# Patient Record
Sex: Male | Born: 1950
Health system: Southern US, Community
[De-identification: ages and names within clinical notes are randomized; demographics above are authoritative.]

## PROBLEM LIST (undated history)

## (undated) DIAGNOSIS — K589 Irritable bowel syndrome without diarrhea: Secondary | ICD-10-CM

## (undated) DIAGNOSIS — I728 Aneurysm of other specified arteries: Secondary | ICD-10-CM

## (undated) DIAGNOSIS — R911 Solitary pulmonary nodule: Secondary | ICD-10-CM

## (undated) DIAGNOSIS — I319 Disease of pericardium, unspecified: Secondary | ICD-10-CM

## (undated) DIAGNOSIS — R0602 Shortness of breath: Secondary | ICD-10-CM

## (undated) DIAGNOSIS — Z77090 Contact with and (suspected) exposure to asbestos: Secondary | ICD-10-CM

## (undated) DIAGNOSIS — Z87438 Personal history of other diseases of male genital organs: Secondary | ICD-10-CM

## (undated) DIAGNOSIS — K219 Gastro-esophageal reflux disease without esophagitis: Secondary | ICD-10-CM

## (undated) HISTORY — PX: SPINE SURGERY: SHX786

## (undated) HISTORY — DX: Solitary pulmonary nodule: R91.1

## (undated) HISTORY — DX: Irritable bowel syndrome, unspecified: K58.9

## (undated) HISTORY — DX: Shortness of breath: R06.02

## (undated) HISTORY — DX: Gastro-esophageal reflux disease without esophagitis: K21.9

## (undated) HISTORY — DX: Contact with and (suspected) exposure to asbestos: Z77.090

## (undated) HISTORY — DX: Personal history of other diseases of male genital organs: Z87.438

## (undated) HISTORY — DX: Aneurysm of other specified arteries: I72.8

---

## 1992-10-23 HISTORY — PX: HERNIA REPAIR: SHX51

## 2006-09-14 ENCOUNTER — Emergency Department (HOSPITAL_COMMUNITY): Admission: EM | Admit: 2006-09-14 | Discharge: 2006-09-14 | Payer: Self-pay | Admitting: Emergency Medicine

## 2009-04-02 ENCOUNTER — Encounter: Admission: RE | Admit: 2009-04-02 | Discharge: 2009-04-02 | Payer: Self-pay | Admitting: Family Medicine

## 2009-05-03 ENCOUNTER — Ambulatory Visit: Payer: Self-pay | Admitting: Emergency Medicine

## 2009-05-18 ENCOUNTER — Emergency Department (HOSPITAL_COMMUNITY): Admission: EM | Admit: 2009-05-18 | Discharge: 2009-05-18 | Payer: Self-pay | Admitting: Emergency Medicine

## 2009-06-03 ENCOUNTER — Ambulatory Visit: Payer: Self-pay | Admitting: Emergency Medicine

## 2009-06-18 ENCOUNTER — Ambulatory Visit: Payer: Self-pay | Admitting: Emergency Medicine

## 2009-06-18 DIAGNOSIS — R05 Cough: Secondary | ICD-10-CM

## 2009-06-18 DIAGNOSIS — R059 Cough, unspecified: Secondary | ICD-10-CM | POA: Insufficient documentation

## 2009-08-09 ENCOUNTER — Telehealth (INDEPENDENT_AMBULATORY_CARE_PROVIDER_SITE_OTHER): Payer: Self-pay | Admitting: *Deleted

## 2009-08-16 ENCOUNTER — Telehealth (INDEPENDENT_AMBULATORY_CARE_PROVIDER_SITE_OTHER): Payer: Self-pay | Admitting: *Deleted

## 2009-09-06 ENCOUNTER — Ambulatory Visit: Payer: Self-pay | Admitting: Emergency Medicine

## 2009-09-06 ENCOUNTER — Ambulatory Visit: Admission: RE | Admit: 2009-09-06 | Discharge: 2009-09-06 | Payer: Self-pay | Admitting: Emergency Medicine

## 2009-09-06 ENCOUNTER — Encounter: Payer: Self-pay | Admitting: Emergency Medicine

## 2009-09-13 ENCOUNTER — Telehealth (INDEPENDENT_AMBULATORY_CARE_PROVIDER_SITE_OTHER): Payer: Self-pay | Admitting: *Deleted

## 2009-09-27 ENCOUNTER — Telehealth (INDEPENDENT_AMBULATORY_CARE_PROVIDER_SITE_OTHER): Payer: Self-pay | Admitting: *Deleted

## 2009-09-28 ENCOUNTER — Telehealth (INDEPENDENT_AMBULATORY_CARE_PROVIDER_SITE_OTHER): Payer: Self-pay | Admitting: *Deleted

## 2009-10-11 ENCOUNTER — Ambulatory Visit: Payer: Self-pay | Admitting: Emergency Medicine

## 2009-10-12 ENCOUNTER — Telehealth (INDEPENDENT_AMBULATORY_CARE_PROVIDER_SITE_OTHER): Payer: Self-pay | Admitting: *Deleted

## 2009-10-18 ENCOUNTER — Telehealth: Payer: Self-pay | Admitting: Emergency Medicine

## 2009-10-28 ENCOUNTER — Emergency Department (HOSPITAL_COMMUNITY): Admission: EM | Admit: 2009-10-28 | Discharge: 2009-10-28 | Payer: Self-pay | Admitting: Emergency Medicine

## 2009-11-02 ENCOUNTER — Inpatient Hospital Stay (HOSPITAL_BASED_OUTPATIENT_CLINIC_OR_DEPARTMENT_OTHER): Admission: RE | Admit: 2009-11-02 | Discharge: 2009-11-02 | Payer: Self-pay | Admitting: Cardiology

## 2009-11-16 ENCOUNTER — Encounter: Payer: Self-pay | Admitting: Emergency Medicine

## 2009-12-01 ENCOUNTER — Telehealth: Payer: Self-pay | Admitting: Emergency Medicine

## 2009-12-06 ENCOUNTER — Encounter: Payer: Self-pay | Admitting: Emergency Medicine

## 2009-12-06 ENCOUNTER — Telehealth (INDEPENDENT_AMBULATORY_CARE_PROVIDER_SITE_OTHER): Payer: Self-pay | Admitting: *Deleted

## 2010-01-24 ENCOUNTER — Encounter: Payer: Self-pay | Admitting: Emergency Medicine

## 2010-02-07 ENCOUNTER — Encounter: Payer: Self-pay | Admitting: Emergency Medicine

## 2010-02-18 ENCOUNTER — Encounter: Payer: Self-pay | Admitting: Emergency Medicine

## 2010-05-09 ENCOUNTER — Ambulatory Visit: Payer: Self-pay | Admitting: Emergency Medicine

## 2010-05-09 DIAGNOSIS — R930 Abnormal findings on diagnostic imaging of skull and head, not elsewhere classified: Secondary | ICD-10-CM

## 2010-10-03 ENCOUNTER — Encounter
Admission: RE | Admit: 2010-10-03 | Discharge: 2010-10-03 | Payer: Self-pay | Source: Home / Self Care | Attending: Family Medicine | Admitting: Family Medicine

## 2010-10-23 HISTORY — PX: CERVICAL SPINE SURGERY: SHX589

## 2010-11-22 NOTE — Medication Information (Signed)
Summary: Nexium Approved/[facility]  Nexium Approved/[facility]   Imported By: Sherian Rein 02/23/2010 11:31:00  _____________________________________________________________________  External Attachment:    Type:   Image     Comment:   External Document

## 2010-11-22 NOTE — Progress Notes (Signed)
Summary: PRESCRIPT  Phone Note Call from Patient Call back at [phone]   Caller: Patient Call For: Evea Sheek Summary of Call: NEED REFILL FOR NEXIUM PT TAKE 2 PILLS A DAY PT AT THE PHARMACY INSURANCE HAVE PROBLEM WITH FILLING RX WOULD LIKE  SAMPLES UNTIL READY [facility] Initial call taken by: Rickard Patience,  December 01, 2009 10:31 AM  Follow-up for Phone Call        Spoke with pt; aware no samples in office; pt to call pharmacy and have them fax PA for qty to [place] to start PA.Reynaldo Minium CMA  December 01, 2009 10:51 AM

## 2010-11-22 NOTE — Assessment & Plan Note (Signed)
Summary: cough   Visit Type:  Follow-up Copy to:  Dr. Lynelle Doctor Primary Provider/Referring Provider:  Claude Manges, PA  CC:  The patient is here for follow-up. He says he is sob with exertion and has chest tightness. He denies any cough but says he is clearing his throat a lot. This is worse after eating.Marland Kitchen  History of Present Illness: 60 yo never smoker, hx IBS, prostatitis, allergic rhinitis, hx pulm nodule in setting prior asbestos. Last CT scan 2007, was stable from a comparison film 1991 (per pt's report, [facility] in [place]). Referred 05/03/09 for ? COPD following a CXR that was worrisome for hyperinflation. Was being evaluated for cough and chest congestion.   ROV 10/11/09 -- s/p FOB with upper airway irregularity, bx consistent with hyperkeratosis + actinomyces colonies!  Treated for GERD with omeprazole, he cannot recall if he took it or if it helped, have tried to address PND  1.   LUNG, RIGHT UPPER LOBE, BIOPSY:  MINUTE FRAGMENT OF BENIGN BRONCHIAL EPITHELIUM, NO ATYPIA, GRANULOMA OR MALIGNANCY IDENTIFIED.   2.  LARYNX, SUPRAGLOTTIC, BIOPSY:  BENIGN SQUAMOUS MUCOSA WITH FOCAL HYPERKERATOSIS AND PARAKERATOSIS AND ASSOCIATED ACTINOMYCES COLONIES, NO DYSPLASIA OR MALIGNANCY IDENTIFIED.  ROV 05/09/10 -- returns for f/u. Still feels pressure and congestion in his chest. Gets winded with exertion. Still gets indigestion, gets PVC's after eating - eval by [facility] and had reassuring cath. PFT's borderline for AFL. Still on Nexium, helped the cough and chest pressure some. Saw Dr Dionne Ano regarding UA irritation, identified 80% septal devation that needs repair. On saline spray and nasal steroid few times a week.   Current Medications (verified): 1)  Aspirin 81 Mg Tbec (Aspirin) .... Take 1 Tablet By Mouth Once A Day 2)  Glucosamine 1500mg  + Msm 1500mg  .... Once Daily 3)  Pravastatin Sodium 40 Mg Tabs (Pravastatin Sodium) .... Take 1 Tablet By Mouth Once A Day 4)  Preservision  Areds  Caps (Multiple Vitamins-Minerals) .Marland Kitchen.. 1 Tab Two Times A Day 5)  Mens Multivitamin Plus  Tabs (Multiple Vitamins-Minerals) .... Once Daily 6)  Loratadine 10 Mg Tabs (Loratadine) .... Take 1 Tablet By Mouth Once A Day 7)  Nasonex 50 Mcg/act  Susp (Mometasone Furoate) .... Two Puffs Each Nostril Daily 8)  Nexium 40 Mg Cpdr (Esomeprazole Magnesium) .... Take 1 Capsule By Mouth Once A Day 9)  Keflex 750 Mg Caps (Cephalexin) .Marland Kitchen.. 1 By Mouth Four Times Daily For Underarm Cysts  Allergies (verified): 1)  ! Epinephrine  Vital Signs:  Patient profile:   60 year old male Height:      73 inches (185.42 cm) Weight:      185.38 pounds (84.26 kg) BMI:     24.55 O2 Sat:      99 % on Room air Temp:     97.6 degrees F (36.44 degrees C) oral Pulse rate:   66 / minute BP sitting:   102 / 64  (right arm) Cuff size:   regular  Vitals Entered By: Michel Bickers CMA (May 09, 2010 9:49 AM)  O2 Sat at Rest %:  99 O2 Flow:  Room air CC: The patient is here for follow-up. He says he is sob with exertion and has chest tightness. He denies any cough but says he is clearing his throat a lot. This is worse after eating. Comments Medications reviewed. Daytime phone verified. Michel Bickers CMA  May 09, 2010 9:49 AM   Physical Exam  General:  well developed, well nourished, in no acute  distress Head:  normocephalic and atraumatic Eyes:  conjunctiva and sclera clear Nose:  no deformity, discharge, inflammation, or lesions Mouth:  posterior pharyngeal erythema, Mallenpati 1 Neck:  no masses, thyromegaly, or abnormal cervical nodes Chest Wall:  no deformities noted Lungs:  clear bilaterally to auscultation and percussion Heart:  regular rate and rhythm, S1, S2 without murmurs, rubs, gallops, or clicks Abdomen:  not examined Msk:  no deformity or scoliosis noted with normal posture Extremities:  no clubbing, cyanosis, edema, or deformity noted Neurologic:  non-focal Skin:  intact without lesions or  rashes Psych:  alert and cooperative; normal mood and affect; normal attention span and concentration   Impression & Recommendations:  Problem # 1:  COUGH (ICD-786.2)  Believe contributions of nasal obstruction and GERD. PFT's reassuring - Nexium - f/u w Annalee Genta for possible repair septal deviation and obstruction - ROV 1 yr  Orders: Est. Patient Level IV (76283)  Problem # 2:  CT, CHEST, ABNORMAL (ICD-793.1)  Hx asbestos exposure with stable nodule in [place] - f/u 1 yr and reimage at that time, probably CXR  Orders: Est. Patient Level IV (15176)  Medications Added to Medication List This Visit: 1)  Keflex 750 Mg Caps (Cephalexin) .Marland Kitchen.. 1 by mouth four times daily for underarm cysts  Patient Instructions: 1)  Continue to follow with Dr Annalee Genta as planned. 2)  Continue your Nexium once daily  3)  Follow up with Dr Delton Coombes in 1 year or as needed

## 2010-11-22 NOTE — Medication Information (Signed)
Summary: Nexium/[facility]  Nexium/[facility]   Imported By: Sherian Rein 02/23/2010 11:33:14  _____________________________________________________________________  External Attachment:    Type:   Image     Comment:   External Document

## 2010-11-22 NOTE — Letter (Signed)
Summary: [facility]  [facility]   Imported By: Sherian Rein 03/01/2010 08:06:34  _____________________________________________________________________  External Attachment:    Type:   Image     Comment:   External Document

## 2010-11-22 NOTE — Medication Information (Signed)
Summary: [facility]  [facility]   Imported By: Sherian Rein 12/11/2009 11:40:48  _____________________________________________________________________  External Attachment:    Type:   Image     Comment:   External Document

## 2010-11-22 NOTE — Consult Note (Signed)
Summary: [facility]  [facility]   Imported By: Lester Sutherland 12/14/2009 08:11:36  _____________________________________________________________________  External Attachment:    Type:   Image     Comment:   External Document

## 2010-11-22 NOTE — Progress Notes (Signed)
Summary: returning call  Phone Note Call from Patient Call back at [phone]   Caller: Patient Call For: byrum Summary of Call: returning phone call from last week. Initial call taken by: Darletta Moll,  December 06, 2009 11:00 AM  Follow-up for Phone Call        Lawson Fiscal, did you call this pt? Please advise, thanks! Vernie Murders  December 06, 2009 11:01 AM  The only thing I see is from 12/01/09 and Florentina Addison spoke with the patient Re: Nexium and prior auth. I did not try to call him. Michel Bickers [facility]  December 06, 2009 11:31 AM   Additional Follow-up for Phone Call Additional follow up Details #1::        Called and spoke with pt.  He states that after he spoke with Florentina Addison about nexium PA, he got a msg from Jakin to call us back.  Please advise what to tell pt thanks Vernie Murders  December 06, 2009 11:41 AM'    Additional Follow-up for Phone Call Additional follow up Details #2::    sorry, my documentation was on the PA request form.  per RB's note in 09/2009, pt was to try the nexium two times a day x30month then go to once daily.  i asked patient if he would be willing to try the nexium once daily and not worry about the PA, or if he still wants to try it two times a day and wait for the PA.  pt stated that he is willing to try it once daily.    rx changed on pt's med list.  called spoke with pharmacist and informed them that pt will be trying the nexium once daily.  pharmacist verbalized her understanding.  PA request sent to be scanned in EMR. Follow-up by: Boone Master CNA,  December 06, 2009 12:59 PM  New/Updated Medications: NEXIUM 40 MG CPDR (ESOMEPRAZOLE MAGNESIUM) Take 1 capsule by mouth once a day Prescriptions: NEXIUM 40 MG CPDR (ESOMEPRAZOLE MAGNESIUM) Take 1 capsule by mouth once a day  #30 x 3   Entered by:   Boone Master CNA   Authorized by:   Leslye Peer MD   Signed by:   Boone Master CNA on 12/06/2009   Method used:   Telephoned to ...       [facility]  [address]  [phone]* (retail)       [address]       [place], [place]  [address]       Ph: [phone] or [phone]       Fax: [phone]   RxID:   [id number]

## 2011-01-08 LAB — BASIC METABOLIC PANEL
BUN: 14 mg/dL (ref 6–23)
Chloride: 104 mEq/L (ref 96–112)
Creatinine, Ser: 1.05 mg/dL (ref 0.4–1.5)
GFR calc Af Amer: >60 mL/min (ref >60)
GFR calc non Af Amer: >60 mL/min (ref >60)
Potassium: 3.9 mEq/L (ref 3.5–5.1)

## 2011-01-08 LAB — D-DIMER, QUANTITATIVE: D-Dimer, Quant: <0.22 ug/mL-FEU (ref 0.00–0.48)

## 2011-01-08 LAB — DIFFERENTIAL
Lymphocytes Relative: 9 % — ABNORMAL LOW (ref 12–46)
Lymphs Abs: 0.9 10*3/uL (ref 0.7–4.0)
Monocytes Relative: 8 % (ref 3–12)
Neutrophils Relative %: 82 % — ABNORMAL HIGH (ref 43–77)

## 2011-01-08 LAB — POCT CARDIAC MARKERS
CKMB, poc: <1 ng/mL — ABNORMAL LOW (ref 1.0–8.0)
Myoglobin, poc: 75.1 ng/mL (ref 12–200)
Troponin i, poc: <0.05 ng/mL (ref 0.00–0.09)
Troponin i, poc: <0.05 ng/mL (ref 0.00–0.09)

## 2011-01-08 LAB — CBC
Platelets: 207 10*3/uL (ref 150–400)
RBC: 4.53 MIL/uL (ref 4.22–5.81)
WBC: 10 10*3/uL (ref 4.0–10.5)

## 2011-01-23 ENCOUNTER — Other Ambulatory Visit: Payer: Self-pay | Admitting: Emergency Medicine

## 2011-01-23 MED ORDER — ESOMEPRAZOLE MAGNESIUM 40 MG PO CPDR: 40.0000 mg | DELAYED_RELEASE_CAPSULE | Freq: Every day | ORAL | Status: DC

## 2011-01-23 NOTE — Telephone Encounter (Signed)
New RX for Nexium called to [facility].

## 2011-01-26 ENCOUNTER — Ambulatory Visit: Payer: 59 | Admitting: Physical Therapy

## 2012-05-07 ENCOUNTER — Ambulatory Visit
Admission: RE | Admit: 2012-05-07 | Discharge: 2012-05-07 | Disposition: A | Discharge status: Home / Self Care | Payer: 59 | Source: Ambulatory Visit | Attending: Family Medicine | Admitting: Family Medicine

## 2012-05-07 ENCOUNTER — Other Ambulatory Visit: Payer: Self-pay | Admitting: Family Medicine

## 2012-05-07 DIAGNOSIS — R109 Unspecified abdominal pain: Secondary | ICD-10-CM

## 2012-05-07 MED ORDER — IOHEXOL 300 MG/ML  SOLN
100.0000 mL | Freq: Once | INTRAMUSCULAR | Status: AC | PRN
  Administered 2012-05-07: 100 mL via INTRAVENOUS

## 2012-05-07 MED ORDER — IOHEXOL 300 MG/ML  SOLN
30.0000 mL | Freq: Once | INTRAMUSCULAR | Status: AC | PRN
  Administered 2012-05-07: 30 mL via ORAL

## 2012-05-20 ENCOUNTER — Encounter: Payer: Self-pay | Admitting: Vascular Surgery

## 2012-05-23 ENCOUNTER — Encounter: Payer: Self-pay | Admitting: Vascular Surgery

## 2012-05-24 ENCOUNTER — Encounter: Payer: Self-pay | Admitting: Vascular Surgery

## 2012-05-24 ENCOUNTER — Ambulatory Visit (INDEPENDENT_AMBULATORY_CARE_PROVIDER_SITE_OTHER): Payer: 59 | Admitting: Vascular Surgery

## 2012-05-24 VITALS — BP 127/63 | HR 68 | Temp 98.3°F | Ht 73.0 in | Wt 182.0 lb

## 2012-05-24 DIAGNOSIS — I728 Aneurysm of other specified arteries: Secondary | ICD-10-CM | POA: Insufficient documentation

## 2012-05-24 NOTE — Progress Notes (Signed)
[facility] [place]  Referred by: Cain Saupe, MD [address] [place] [place] [address],  Reason for referral: celiac and splenic aneurysm  History of Present Illness  The patient is a 61 y.o. (Sep 02, 1951) male who presents with chief complaint: resolving abdominal.  The patient was seen by his PCP a few weeks ago with moderate intensity abdominal pain of vague character, associated with some diarrhea without fever or chills.  As part of that work-up, he underwent a CT Abd/pelvis which found a splenic and celiac aneurysm.  The rest of his work-up was consistent with a bacterial infection which was then treated with antibiotics.  At this point, his pain is mostly resolved.  He does not some chronic "pulling" sensation in RLQ which previously was attributed to a RIH, which was repaired.  The patient does not have back pain.  The patient's risk factors for aneurysmal disease included: family hx (uncle had AAA).  The patient never smoked cigarettes.  Past Medical History  Diagnosis Date  . SOB (shortness of breath) on exertion   . IBS (irritable bowel syndrome)   . H/O prostatitis   . Pulmonary nodule   . H/O asbestos exposure   . GERD (gastroesophageal reflux disease)   . Celiac artery aneurysm     Past Surgical History  Procedure Date  . Hernia repair 1994    right   . Cervical spine surgery 2012    History   Social History  . Marital Status: Married    Spouse Name: N/A    Number of Children: N/A  . Years of Education: N/A   Occupational History  . Not on file.   Social History Main Topics  . Smoking status: Never Smoker   . Smokeless tobacco: Never Used  . Alcohol Use: 1.2 oz/week    2 Glasses of wine per week  . Drug Use: No  . Sexually Active: Not on file   Other Topics Concern  . Not on file   Social History Narrative  . No narrative on file    Family History  Problem Relation Age of Onset  . Cancer Mother   . Diabetes  Father   . Heart attack Brother     Current Outpatient Prescriptions on File Prior to Visit  Medication Sig Dispense Refill  . aspirin 81 MG tablet Take 81 mg by mouth daily.      Marland Kitchen esomeprazole (NEXIUM) 40 MG capsule Take 40 mg by mouth daily before breakfast.      . ezetimibe (ZETIA) 10 MG tablet Take 10 mg by mouth daily.      . Glucosamine-Chondroit-Vit C-Mn (GLUCOSAMINE 1500 COMPLEX PO) Take by mouth.      . loratadine (CLARITIN) 10 MG tablet Take 10 mg by mouth daily.      . mometasone (NASONEX) 50 MCG/ACT nasal spray Place 2 sprays into the nose daily.      . Multiple Vitamin (MULTIVITAMIN) tablet Take 1 tablet by mouth daily.      Marland Kitchen esomeprazole (NEXIUM) 40 MG capsule Take 1 capsule (40 mg total) by mouth daily.  90 capsule  3  . pravastatin (PRAVACHOL) 40 MG tablet Take 40 mg by mouth daily.        Allergies  Allergen Reactions  . Epinephrine     REACTION: PVC's    REVIEW OF SYSTEMS:  (Positives checked otherwise negative)  CARDIOVASCULAR: [ ]  chest pain    [ ]  chest pressure    [ ]   palpitations   [ ]  orthopnea   [ ]  dyspnea on exert. [ ]  claudication    [ ]  rest pain     [ ]  DVT     [ ]  phlebitis  PULMONARY:    [ ]  productive cough [ ]  asthma  [ ]  wheezing  NEUROLOGIC:    [ ]  weakness    [ ]  paresthesias   [ ]  aphasia    [ ]  amaurosis    [ ]  dizziness  HEMATOLOGIC:    [ ]  bleeding problems  [ ]  clotting disorders  MUSCULOSKEL: [ ]  joint pain     [ ]  joint swelling  GASTROINTEST:  [ ]   blood in stool   [ ]   hematemesis  GENITOURINARY:   [ ]   dysuria    [ ]   hematuria  PSYCHIATRIC:   [ ]  history of major depression  INTEGUMENTARY: [ ]  rashes    [ ]  ulcers  CONSTITUTIONAL:  [ ]  fever     [ ]  chills  Physical Examination  Filed Vitals:   05/24/12 0838  BP: 127/63  Pulse: 68  Temp: 98.3 F (36.8 C)  TempSrc: Oral  Height: 6\' 1"  (1.854 m)  Weight: 182 lb (82.555 kg)  SpO2: 100%   Body mass index is 24.01 kg/(m^2).  General: A&O x 3, WDWN  Head:  [place]/AT  Ear/Nose/Throat: Hearing grossly intact, nares w/o erythema or drainage, oropharynx w/o Erythema/Exudate  Eyes: PERRLA, EOMI  Neck: Supple, no nuchal rigidity, no palpable LAD  Pulmonary: Sym exp, good air movt, CTAB, no rales, rhonchi, & wheezing  Cardiac: RRR, Nl S1, S2, no Murmurs, rubs or gallops  Vascular: Vessel Right Left  Radial Palpable Palpable  Brachial Palpable Palpable  Carotid Palpable, without bruit Palpable, without bruit  Aorta Non-palpable N/A  Femoral Palpable Palpable  Popliteal Non-palpable Non-palpable  PT Non-Palpable Faintly Palpable  DP Palpable Palpable   Gastrointestinal: soft, NTND, -G/R, - HSM, - masses, - CVAT B  Musculoskeletal: M/S 5/5 throughout , Extremities without ischemic changes   Neurologic: CN 2-12 intact , Pain and light touch intact in extremities   Psychiatric: Judgment intact, Mood & affect appropriate for pt's clinical situation  Dermatologic: See M/S exam for extremity exam, no rashes otherwise noted  Lymph : No Cervical, Axillary, or Inguinal lymphadenopathy   CT Abd/Pelvis w/ contrast (05/07/12)   No extraluminal bowel inflammatory process, free fluid or free air.   Portions of colon and small bowel (including terminal ileum) are under distended slightly limiting evaluation.   Aneurysmal dilation of the celiac artery measuring 1.4 x 1.9 cm spanning over 2.5 cm length.   Suggestive of splenic artery aneurysm measuring up to 10.6 mm in the region of the pancreatic body tail junction (series 2 image 23).   Primary pancreatic lesion felt to be less likely consideration.   Right base lung nodule with follow up as noted above.  Based on my evaluation of the CT, this patient has a celiac artery aneurysm without any signs of impending rupture and no thrombus.  I cannot clearly identify a splenic artery aneurysm though there is a calcified area that could be such.  Outside Studies/Documentation 3 pages of outside  documents were reviewed including: CT report.  Medical Decision Making  The patient is a 61 y.o. male who presents with: celiac artery aneurysm, possible splenic artery calcification   Based on this patient's exam and diagnostic studies, he needs annual CTA Abd/pelvis to evaluate celiac artery aneurysm and evaluate  the splenic artery.  CA aneurysm is rare so there is no consensus in the literature in regards to repair.  I don't see any thing clinically or radiographically that would force intervention at this point.  The patient agrees with annual surveillance at this point.  I don't think a mesenteric duplex will be adequate given the location of these two vascular bed.  The bowel gas interference would greatly degrade the value of any ultrasound study.  Even a 1 cm asx splenic aneurysm does not need intervention, so I would fully evaluate this in one year with the CTA  I emphasized the importance of maximal medical management including strict control of blood pressure, blood glucose, and lipid levels, antiplatelet agents, obtaining regular exercise, and cessation of smoking.    Thank you for allowing [place] to participate in this patient's care.  Leonides Sake, MD [facility] Office: [phone] Pager: [phone]  05/24/2012, 9:05 AM

## 2012-08-26 ENCOUNTER — Other Ambulatory Visit: Payer: Self-pay | Admitting: Gastroenterology

## 2012-08-30 ENCOUNTER — Other Ambulatory Visit: Payer: Self-pay | Admitting: Family Medicine

## 2012-08-30 DIAGNOSIS — H532 Diplopia: Secondary | ICD-10-CM

## 2012-09-02 ENCOUNTER — Ambulatory Visit
Admission: RE | Admit: 2012-09-02 | Discharge: 2012-09-02 | Disposition: A | Discharge status: Home / Self Care | Payer: 59 | Source: Ambulatory Visit | Attending: Family Medicine | Admitting: Family Medicine

## 2012-09-02 DIAGNOSIS — H532 Diplopia: Secondary | ICD-10-CM

## 2012-11-22 ENCOUNTER — Other Ambulatory Visit: Payer: Self-pay | Admitting: Ophthalmology

## 2012-11-22 ENCOUNTER — Ambulatory Visit
Admission: RE | Admit: 2012-11-22 | Discharge: 2012-11-22 | Disposition: A | Discharge status: Home / Self Care | Payer: 59 | Source: Ambulatory Visit | Attending: Ophthalmology | Admitting: Ophthalmology

## 2012-11-22 DIAGNOSIS — Z01811 Encounter for preprocedural respiratory examination: Secondary | ICD-10-CM

## 2012-11-23 HISTORY — PX: EYE SURGERY: SHX253

## 2013-05-16 ENCOUNTER — Telehealth: Payer: Self-pay | Admitting: Vascular Surgery

## 2013-05-16 ENCOUNTER — Other Ambulatory Visit: Payer: Self-pay | Admitting: *Deleted

## 2013-05-16 DIAGNOSIS — I728 Aneurysm of other specified arteries: Secondary | ICD-10-CM

## 2013-05-16 NOTE — Telephone Encounter (Signed)
Message copied by Jena Gauss on Fri May 16, 2013 11:48 AM ------      Message from: Melene Plan      Created: Tue May 13, 2013 10:36 AM      Regarding: FW: CTA       Jilda Panda, this is a mess; I understand why Elon Jester doesn't understand. However,looking at the [facility] chart he needs this appt and a CTA for yearly follow up of a celiac and splenic artery aneurysm. I don't understand why it wasn't approved without MDs talking. We need to get this approved without [facility] involved if possible.      Thanks      Darel Hong      ----- Message -----         From: Jena Gauss         Sent: 05/13/2013  10:05 AM           To: Melene Plan, RN      Subject: FW: CTA                                                  Please decode this. Does [facility] want a f/u appt on 05/30/13 or should I cancel that?            ----- Message -----         From: Fransisco Hertz, MD         Sent: 05/13/2013   9:52 AM           To: Jena Gauss      Subject: RE: CTA                                                  Annual follow up with CTA            ----- Message -----         From: Jena Gauss         Sent: 05/13/2013   9:00 AM           To: Fransisco Hertz, MD      Subject: RE: CTA                                                  Would you like me to also cancel the f/u?            ----- Message -----         From: Kateri Mc         Sent: 05/12/2013   3:34 PM           To: [facility]      Subject: FW: CTA                                                  Probably not, but check with Dr. Imogene Burn.                     -----  Message -----         From: Jena Gauss         Sent: 05/12/2013   3:17 PM           To: Kateri Mc      Subject: RE: CTA                                                  Shall I cancel the f/u as well?            ----- Message -----         From: Kateri Mc         Sent: 05/12/2013   1:13 PM           To: [facility] Pool      Subject: FW: CTA                                                   PLEASE SEE DR. CHEN'S NOTE AND CANCEL THIS PATIENT'S CTA.  Rollene Fare, DEBBIE                        ----- Message -----         From: Fransisco Hertz, MD         Sent: 05/12/2013  12:18 PM           To: Kateri Mc      Subject: RE: CTA                                                  Don't need CTA this year, rather one NEXT year 2015.            ----- Message -----         From: Kateri Mc         Sent: 05/05/2013   2:37 PM           To: Fransisco Hertz, MD      Subject: CTA                                                      Dr Imogene Burn, [facility] has requested a peer-to-peer review for this patient in order to pre-cert the CTA of the abdomen an pelvis scheduled for 05/30/13.  Please call [phone] option #3 and case # [id number] for the review.  I will need the pre-cert information once this is authorized, so please let me know when complete.             If you have any questions, please let me know.              Thank you,            Chad Cordial                                                 ------

## 2013-05-29 ENCOUNTER — Encounter: Payer: Self-pay | Admitting: Vascular Surgery

## 2013-05-30 ENCOUNTER — Ambulatory Visit
Admission: RE | Admit: 2013-05-30 | Discharge: 2013-05-30 | Disposition: A | Discharge status: Home / Self Care | Payer: 59 | Source: Ambulatory Visit | Attending: Vascular Surgery | Admitting: Vascular Surgery

## 2013-05-30 ENCOUNTER — Encounter: Payer: Self-pay | Admitting: Vascular Surgery

## 2013-05-30 ENCOUNTER — Other Ambulatory Visit: Payer: 59

## 2013-05-30 ENCOUNTER — Ambulatory Visit (INDEPENDENT_AMBULATORY_CARE_PROVIDER_SITE_OTHER): Payer: 59 | Admitting: Vascular Surgery

## 2013-05-30 VITALS — BP 128/76 | HR 50 | Ht 73.0 in | Wt 183.8 lb

## 2013-05-30 DIAGNOSIS — I728 Aneurysm of other specified arteries: Secondary | ICD-10-CM

## 2013-05-30 MED ORDER — IOHEXOL 350 MG/ML SOLN
100.0000 mL | Freq: Once | INTRAVENOUS | Status: AC | PRN
  Administered 2013-05-30: 100 mL via INTRAVENOUS

## 2013-05-30 NOTE — Progress Notes (Signed)
[facility] [place]  Established Mesenteric Aneurysm  History of Present Illness  The patient is a 62 y.o. (05-01-1951) male who presents with chief complaint: follow up for celiac and splenic aneurysm.  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  The patient denies any back or abdominal pain.  He has complete resolution of his prior abdominal pain.  He denies any hematemesis, hematochezia, or melena.  The patient is not a smoker.  The patient's PMH, PSH, SH, FamHx, Med, and Allergies are unchanged from 05/24/12.  On ROS today: no abdominal pain, no flank pain, no smoking history  Physical Examination  Filed Vitals:   05/30/13 1057  BP: 128/76  Pulse: 50  Height: 6\' 1"  (1.854 m)  Weight: 183 lb 12.8 oz (83.371 kg)  SpO2: 100%   Body mass index is 24.25 kg/(m^2).  General: A&O x 3, WD, thin  Pulmonary: Sym exp, good air movt, CTAB, no rales, rhonchi, & wheezing  Cardiac: RRR, Nl S1, S2, no Murmurs, rubs or gallops  Vascular: Vessel Right Left  Radial Palpable Palpable  Brachial Palpable Palpable  Carotid Palpable, without bruit Palpable, without bruit  Aorta Not palpable N/A  Femoral Palpable Palpable  Popliteal Not palpable Not palpable  PT Palpable Palpable  DP Palpable Palpable   Gastrointestinal: soft, NTND, -G/R, - HSM, - masses, - CVAT B  Musculoskeletal: M/S 5/5 throughout , Extremities without ischemic changes   Neurologic: Pain and light touch intact in extremities , Motor exam as listed above  CTA Abd/pelvis (05/30/2013)  1. Stable to incrementally enlarged saccular celiac artery aneurysm measures 2.1 x 1.1 cm in greatest transverse dimension.  The aneurysm is best measured on the coronal reformats. Recommend annual follow-up abdominal CTA for at least two additional years to confirm stability.  2. Stable small splenic artery aneurysms in the mid splenic artery (region of the pancreatic body) and splenic  hilum.  3. One year stability of 6 mm right lower lobe pulmonary nodule. Recommend continued attention on annual follow-up imaging.  4. Additional ancillary findings as above without significant interval change.  Based on my review of this patient CTA, his CA aneurysm is unchanged with diameter ~1.4 mm.  An orthogonal measurement would be necessary to be completely accurate, which is not possible without 3D recon.  There are  two small splenic aneurysms (<2 cm).  Medical Decision Making  The patient is a 62 y.o. male who presents with: asymptomatic celiac aneurysm of no change in size, two small splenic aneurysms.   The patient is completely asx at this point with minimal change in celiac aneurysm size.  There is controversy in the literature in regards management of celiac artery aneurysm due to the limited number of such in any center.   I discussed the risk of non-operative management including rupture and subsequent death, but we agreed that at this point, there is nothing forcing [place] to proceed with surgical intervention on his celiac aneurysm.    The two splenic aneurysms are too small to require any immediate intervention.  We will continued with q6 mesenteric duplex and repeat the CTA in 2 years if the duplex is stable.  This is to limit the radiation risks of the CTA.  I emphasized the importance of maximal medical management including strict control of blood pressure, blood glucose, and lipid levels, antiplatelet agents, obtaining regular exercise, and cessation of smoking.    Thank you for allowing [place] to participate in this patient's care.  Leonides Sake, MD [facility] [place] Office: [phone] Pager: [phone]  05/30/2013, 1:32 PM

## 2013-08-28 ENCOUNTER — Other Ambulatory Visit: Payer: Self-pay

## 2013-12-05 ENCOUNTER — Other Ambulatory Visit (HOSPITAL_COMMUNITY): Payer: 59

## 2013-12-05 ENCOUNTER — Ambulatory Visit: Payer: 59 | Admitting: Vascular Surgery

## 2013-12-22 ENCOUNTER — Other Ambulatory Visit (HOSPITAL_COMMUNITY): Payer: 59

## 2013-12-22 ENCOUNTER — Ambulatory Visit: Payer: 59 | Admitting: Family

## 2014-01-02 ENCOUNTER — Encounter: Payer: Self-pay | Admitting: Family

## 2014-01-05 ENCOUNTER — Ambulatory Visit: Payer: 59 | Admitting: Family

## 2014-01-05 ENCOUNTER — Other Ambulatory Visit (HOSPITAL_COMMUNITY): Payer: 59

## 2014-01-16 ENCOUNTER — Encounter: Payer: Self-pay | Admitting: Family

## 2014-01-19 ENCOUNTER — Encounter: Payer: Self-pay | Admitting: Family

## 2014-01-19 ENCOUNTER — Ambulatory Visit (HOSPITAL_COMMUNITY)
Admission: RE | Admit: 2014-01-19 | Discharge: 2014-01-19 | Disposition: A | Discharge status: Home / Self Care | Payer: 59 | Source: Ambulatory Visit | Attending: Family | Admitting: Family

## 2014-01-19 ENCOUNTER — Ambulatory Visit (INDEPENDENT_AMBULATORY_CARE_PROVIDER_SITE_OTHER): Payer: 59 | Admitting: Family

## 2014-01-19 VITALS — BP 116/74 | HR 61 | Resp 14 | Ht 73.0 in | Wt 187.0 lb

## 2014-01-19 DIAGNOSIS — I728 Aneurysm of other specified arteries: Secondary | ICD-10-CM

## 2014-01-19 NOTE — Progress Notes (Signed)
Established Mesenteric Aneurysms  History of Present Illness  Leonard Noble is a 63 y.o. (1951/08/29) male patient of Dr. Bridgett Larsson who presents with chief complaint: follow up for celiac and splenic aneurysm. 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 The patient denies any back or abdominal pain. He has complete resolution of his prior abdominal pain. He denies any hematemesis, hematochezia, or melena. The patient is not a smoker.  The patient denies food fear.   He has mid abdominal intermittent pain for the last couple of days, associated with what he eats the night before. He denies weight loss, put on a few pounds over the Winter. He states he has borderline DM, his father had late onset DM. He denies cardiac issues other than occasional PVC's. His ophthalmologist told him he may have had an occular stroke in the left eye in the past, or the findings may be due to an old blow to the head. He no longer takes pravastatin due to myalgias. 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 He sees a cardiologist regularly.   Past Medical History  Diagnosis Date  . SOB (shortness of breath) on exertion   . IBS (irritable bowel syndrome)   . H/O prostatitis   . Pulmonary nodule   . H/O asbestos exposure   . GERD (gastroesophageal reflux disease)   . Celiac artery aneurysm     Past Surgical History  Procedure Laterality Date  . Hernia repair  1994    right   . Cervical spine surgery  2012   History   Social History  . Marital Status: Married    Spouse Name: N/A    Number of Children: N/A  . Years of Education: N/A   Occupational History  . Not on file.   Social History Main Topics  . Smoking status: Never Smoker   . Smokeless tobacco: Never Used  . Alcohol Use: 1.2 oz/week    2 Glasses of wine per week  . Drug Use:  No  . Sexual Activity: Not on file   Other Topics Concern  . Not on file   Social History Narrative  . No narrative on file   Family History  Problem Relation Age of Onset  . Cancer Mother   . Diabetes Father   . Heart attack Brother    Current Outpatient Prescriptions on File Prior to Visit  Medication Sig Dispense Refill  . amoxicillin (AMOXIL) 500 MG tablet Take 500 mg by mouth 2 (two) times daily.      Marland Kitchen aspirin 81 MG tablet Take 81 mg by mouth daily.      . clarithromycin (BIAXIN) 500 MG tablet Take 500 mg by mouth Twice daily.      Marland Kitchen esomeprazole (NEXIUM) 40 MG capsule Take 1 capsule (40 mg total) by mouth daily.  90 capsule  3  . esomeprazole (NEXIUM) 40 MG capsule Take 40 mg by mouth daily before breakfast.      . ezetimibe (ZETIA) 10 MG tablet Take 10 mg by mouth daily.      . lansoprazole (PREVACID) 30 MG capsule       . mometasone (NASONEX) 50 MCG/ACT nasal spray Place 2 sprays into the nose daily.      . Multiple Vitamin (MULTIVITAMIN) tablet Take 1 tablet by mouth daily.      Marland Kitchen  pravastatin (PRAVACHOL) 40 MG tablet Take 40 mg by mouth daily.      . Probiotic Product (PROBIOTIC DAILY PO) Take by mouth as needed.        No current facility-administered medications on file prior to visit.   Allergies  Allergen Reactions  . Epinephrine     REACTION: PVC's    On ROS today: See HPI for pertinent positives and negatives.   Physical Examination  Filed Vitals:   01/19/14 0938  BP: 116/74  Pulse: 61  Resp: 14   Filed Weights   01/19/14 0938  Weight: 187 lb (84.823 kg)   Body mass index is 24.68 kg/(m^2).  General: A&O x 3, WDWN, Cachectic / Ill appear / Somulent  Pulmonary: Sym exp, good air movt, CTAB, no rales, rhonchi, or wheezing.  Cardiac: RRR, Nl S1, S2, no Murmurs detected.  Vascular: Vessel Right Left  Radial Palpable Palpable  Carotid without bruit  without bruit  Aorta  palpable N/A  Femoral Palpable Palpable  Popliteal Not palpable Not  palpable  PT Palpable Palpable  DP Palpable Palpable   Gastrointestinal: soft,mild periumbilical pain to palpation, -G/R, - HSM, - masses, - CVAT B.  Musculoskeletal: M/S 5/5 throughout, Extremities without ischemic changes.  Neurologic: Pain and light touch intact in extremities, Motor exam as listed above.  Non-Invasive Vascular Imaging  Mesenteric Duplex (Date: 01/19/2014):   Ao: 2.6 cm  Celiac artery: 1.78 cm  SMA: unable to visualize  IMA: unable to visualize  MESENTERIC ARTERY DUPLEX EVALUATION    INDICATION:     PREVIOUS INTERVENTION(S):     DUPLEX EXAM:     ARTERY PEAK SYSTOLIC VELOCITY (cm/s) IMAGE  Aorta 95 2.6cm  Celiac 228 1.78cm x 1.74cm  Superior Mesenteric Artery - Proximal 387   Superior Mesenteric Artery - Mid 264   Inferior Mesenteric Artery    Hepatic    Splenic       ADDITIONAL FINDINGS:     IMPRESSION: 1. Technically difficult study due to bowel gas and anatomical location. 2. The splenic artery could not be visualized. 3. The celiac axis measures 1.78cm x 1.74cm with moderate plaque immediately beyond the take-off. Artery distal to this cannot be visualized; measurement may not be truly representative of aneurysm observed on CT scan.   CTA Abd/pelvis (05/30/2013)  1. Stable to incrementally enlarged saccular celiac artery aneurysm measures 2.1 x 1.1 cm in greatest transverse dimension.  The aneurysm is best measured on the coronal reformats. Recommend annual follow-up abdominal CTA for at least two additional years to confirm stability.  2. Stable small splenic artery aneurysms in the mid splenic artery (region of the pancreatic body) and splenic hilum.  3. One year stability of 6 mm right lower lobe pulmonary nodule. Recommend continued attention on annual follow-up imaging.  4. Additional ancillary findings as above without significant interval change.  Based on Dr. Lianne Moris review of this patient CTA, his CA aneurysm is unchanged with diameter  ~1.4 mm. An orthogonal measurement would be necessary to be completely accurate, which is not possible without 3D recon. There are  two small splenic aneurysms (<2 cm).   Medical Decision Making  Leonard Noble is a 63 y.o. male  who presents with chief complaint: follow up for celiac and splenic aneurysm. 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 The patient denies any back or abdominal pain. He has complete resolution of his prior abdominal pain. He denies any hematemesis, hematochezia, or melena. The patient is not a  smoker.  The patient denies food fear.     Dr. Lianne Moris assessment from 05/30/2013: The patient is completely asx at this point with minimal change in celiac aneurysm size. There is controversy in the literature in regards management of celiac artery aneurysm due to the limited number of such in any center. I discussed the risk of non-operative management including rupture and subsequent death, but we agreed that at this point, there is nothing forcing [place] to proceed with surgical intervention on his celiac aneurysm. The two splenic aneurysms are too small to require any immediate intervention. We will continued with q6 mesenteric duplex and repeat the CTA in 2 years, August, 2016,  if the duplex is stable. This is to limit the radiation risks of the CTA.    Based on her exam and studies, and later discussing with Dr. Bridgett Larsson, I have offered the patient Mesenteric artery Duplex evaluation in 6 months.   I discussed in depth with the patient the nature of atherosclerosis, and emphasized the importance of maximal medical management including strict control of blood pressure, blood glucose, and lipid levels, obtaining regular exercise, and cessation of smoking.    The patient is aware that without maximal medical management the underlying atherosclerotic disease process will progress, limiting the benefit of any interventions.  Thank you for allowing [place] to  participate in this patient's care.  Clemon Chambers, RN, MSN, FNP-C [facility] Office: [phone]  Clinic MD: Kellie Simmering  01/19/2014, 9:14 AM

## 2014-07-17 ENCOUNTER — Encounter: Payer: Self-pay | Admitting: Family

## 2014-07-20 ENCOUNTER — Ambulatory Visit (HOSPITAL_COMMUNITY)
Admission: RE | Admit: 2014-07-20 | Discharge: 2014-07-20 | Disposition: A | Discharge status: Home / Self Care | Payer: 59 | Source: Ambulatory Visit | Attending: Family | Admitting: Family

## 2014-07-20 ENCOUNTER — Ambulatory Visit (INDEPENDENT_AMBULATORY_CARE_PROVIDER_SITE_OTHER): Payer: 59 | Admitting: Family

## 2014-07-20 ENCOUNTER — Encounter: Payer: Self-pay | Admitting: Family

## 2014-07-20 VITALS — BP 110/75 | HR 69 | Resp 16 | Ht 73.0 in | Wt 180.0 lb

## 2014-07-20 DIAGNOSIS — I728 Aneurysm of other specified arteries: Secondary | ICD-10-CM

## 2014-07-20 NOTE — Patient Instructions (Signed)
Chronic Mesenteric Ischemia Mesenteric ischemia is a deficiency of blood in an area of the intestine supplied by an artery that supports the intestine. Chronic mesenteric ischemia, also called intestinal angina, is a long-term condition. It happens when an artery or vein that supports the intestine gradually becomes blocked or narrow, restricting the blood supply to the intestine. When the blood supply to the intestine is severely restricted, the intestines cannot function properly because needed oxygen cannot reach them.  CAUSES   Fatty deposits that build up in an artery or vein but have not yet restricted blood flow entirely.  Differences in some people's anatomy.  Rapid weight loss.  Weakened areas in blood vessel walls (aneurysms).  Swelling and inflammation of blood vessels (such as from fibromuscular dysplasia and arteritis).  Disorders of blood clotting.  Scarring and fibrosis of blood vessels after radiation therapy.  Blood vessel problems after drug use, such as use of cocaine. RISK FACTORS  Being male.  Being over age 67 with a history of coronary or vascular disease.  Smoking.  Congestive heart failure.  Diabetes.  High cholesterol.  High blood pressure (hypertension). SIGNS AND SYMPTOMS   Severe stomachache. Some people become fearful of eating because of pain.   Abdominal pain or cramps that develop about 30 minutes after a meal.   Abdominal pain after eating that becomes worse over time.   Diarrhea.   Nausea.   Vomiting.   Bloating.   Weight loss. DIAGNOSIS  Chronic mesenteric ischemia is often diagnosed after the person's history is taken, a physical exam is done, and tests are taken. Tests may include:  Ultrasounds.  CT scans.  Angiography. This is an imaging test that uses a dye to obtain a picture of blood flow to the intestine.  Endoscopy. This involves putting a scope through the mouth, down the throat, and into the stomach and  intestine to view the intestinal wall and take small tissue samples (biopsies).  Tonometry. In this test a tiny probe is passed through the mouth and into the stomach or intestine and left in place for 24 hours or more. It measures the output of carbon dioxide by the affected tissues. TREATMENT  Treatment may include:   Medicines to reduce blood clotting and increase blood flow.   Surgery to remove the blockage, repair arteries or veins, and restore blood flow. This may involve:   Angioplasty. This is surgery to widen the affected artery, reduce the blockage, and sometimes insert a small, mesh tube (stent).   Bypass surgery. This may be performed to bypass the blockage and reconnect healthy arteries or veins.   A stent in the affected area to help keep blocked arteries open. HOME CARE INSTRUCTIONS  Only take over-the-counter or prescription medicines as directed by your health care provider.   Keep all follow-up appointments as directed by your health care provider.   Prevent the condition from occurring by:  Doing regular exercise.  Keeping a healthy weight.  Keeping a healthy diet.  Managing cholesterol levels.  Keeping blood pressure and heart rhythm problems under control.  Not smoking. SEEK IMMEDIATE MEDICAL CARE IF:  You have severe abdominal pain.   You notice blood in your stool.   You have nausea, vomiting, or diarrhea.   You have a fever. MAKE SURE YOU:  Understand these instructions.  Will watch your condition.  Will get help right away if you are not doing well or get worse. Document Released: 05/29/2011 Document Revised: 06/11/2013 Document Reviewed: 04/09/2013 [employer]  Patient Information 2015 [employer]. This information is not intended to replace advice given to you by your health care provider. Make sure you discuss any questions you have with your health care provider.  

## 2014-07-20 NOTE — Progress Notes (Signed)
Established Mesenteric Ischemia  History of Present Illness  Leonard Noble is a 63 y.o. (04-30-1951) male patient of Dr. Bridgett Larsson who presents with chief complaint: follow up for celiac and splenic aneurysm. 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 The patient denies any back or abdominal pain other than recent constipation related to antibiotic use. He denies any hematemesis, hematochezia, or melena.  The patient is not a smoker.  The patient denies food fear.   He denies weight loss, put on a few pounds over the Winter.  He states he has borderline DM, his father had late onset DM.  His brother died 02-13-14. He had cysts excised in his axilla recently.  He walks 5-6 miles daily in his job. Pt has a history of an anxiety attack. States Dr. Terrence Dupont did a cardiac cath in 2010-02-13 or 02/14/2011, no results on file for this.  He denies cardiac issues other than occasional PVC's.  His ophthalmologist told him he may have had an occular stroke in the left eye in the past, or the findings may be due to an old blow to the head.  He no longer takes pravastatin due to myalgias.  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 He sees a cardiologist regularly.    Past Medical History  Diagnosis Date  . SOB (shortness of breath) on exertion   . IBS (irritable bowel syndrome)   . H/O prostatitis   . Pulmonary nodule   . H/O asbestos exposure   . GERD (gastroesophageal reflux disease)   . Celiac artery aneurysm     Social History History  Substance Use Topics  . Smoking status: Never Smoker   . Smokeless tobacco: Never Used  . Alcohol Use: 1.2 oz/week    2 Glasses of wine per week    Family History Family History  Problem Relation Age of Onset  . Cancer Mother     Multiple Myaloma  . Diabetes Father   . Heart attack Brother       Blood Leg- Leg  . Hypertension Brother     Surgical History Past Surgical History  Procedure Laterality Date  . Hernia repair  1994    right   . Cervical spine surgery  02-14-11    C-Spine - Neck  . Spine surgery    . Eye surgery Left Feb. 2014    Muscle correction    Allergies  Allergen Reactions  . Epinephrine     REACTION: PVC's    Current Outpatient Prescriptions  Medication Sig Dispense Refill  . aspirin 81 MG tablet Take 81 mg by mouth daily.      . Coenzyme Q10 (CO Q-10) 100 MG CAPS Take by mouth daily.      Marland Kitchen ezetimibe (ZETIA) 10 MG tablet Take 10 mg by mouth daily.      . lansoprazole (PREVACID) 30 MG capsule       . Multiple Vitamin (MULTIVITAMIN) tablet Take 1 tablet by mouth daily.      . Probiotic Product (PROBIOTIC DAILY PO) Take by mouth as needed.       . rosuvastatin (CRESTOR) 5 MG tablet Take 5 mg by mouth every other day.      Marland Kitchen amoxicillin (AMOXIL) 500 MG tablet Take 500 mg by mouth 2 (two) times daily.      Marland Kitchen  clarithromycin (BIAXIN) 500 MG tablet Take 500 mg by mouth Twice daily.      Marland Kitchen esomeprazole (NEXIUM) 40 MG capsule Take 1 capsule (40 mg total) by mouth daily.  90 capsule  3  . esomeprazole (NEXIUM) 40 MG capsule Take 40 mg by mouth daily before breakfast.      . mometasone (NASONEX) 50 MCG/ACT nasal spray Place 2 sprays into the nose daily.      . pravastatin (PRAVACHOL) 40 MG tablet Take 40 mg by mouth daily.       No current facility-administered medications for this visit.    ROS: see HPI for pertinent positives and negatives    Physical Examination  Filed Vitals:   07/20/14 0944  BP: 110/75  Pulse: 69  Resp: 16  Height: 6\' 1"  (1.854 m)  Weight: 180 lb (81.647 kg)  SpO2: 98%   Body mass index is 23.75 kg/(m^2).  General: A&O x 3, WDWN Pulmonary: Sym exp, good air movt, CTAB, no rales, rhonchi, or wheezing.  Cardiac: RRR, Nl S1, S2, no Murmurs detected.  Vascular:  Vessel  Right  Left   Radial  Palpable  Palpable   Carotid   without bruit  without bruit   Aorta  palpable  N/A   Femoral  Palpable  Palpable   Popliteal  Not palpable  Not palpable   PT  Palpable  Palpable   DP  Palpable  Palpable    Gastrointestinal: soft,mild periumbilical pain to palpation, -G/R, - HSM, - masses palpated, - CVAT B.  Musculoskeletal: M/S 5/5 throughout, Extremities without ischemic changes.  Neurologic: Pain and light touch intact in extremities, Motor exam as listed above. CN 2-12 grossly intact.  Non-Invasive Vascular Imaging  Mesenteric Duplex (Date: 07/20/2014):  MESENTERIC ARTERY DUPLEX EVALUATION    INDICATION: Follow-up celiac axis/ gastric artery aneurysm    PREVIOUS INTERVENTION(S):     DUPLEX EXAM:     ARTERY PEAK SYSTOLIC VELOCITY (cm/s) IMAGE  Aorta 82   Celiac 495 1.64cm x 1.78cm  Superior Mesenteric Artery - Proximal 252   Superior Mesenteric Artery - Mid 196   Inferior Mesenteric Artery NV   Hepatic    Splenic       ADDITIONAL FINDINGS:     IMPRESSION: 1. Technically difficult study due to anatomical location and bowel gas. 2. Maximum diameter of the celiac axis measures 1.64cm x 1.78cm on today's exam; unable to visualize specific bifurcation; previous CT reported this aneurysm to be in left gastric artery. 3. Elevated velocities are observed at the celiac axis ostium; stenosis cannot be ruled out due to limited visualization.     CTA Abd/pelvis (05/30/2013)  1. Stable to incrementally enlarged saccular celiac artery aneurysm measures 2.1 x 1.1 cm in greatest transverse dimension.  The aneurysm is best measured on the coronal reformats. Recommend annual follow-up abdominal CTA for at least two additional years to confirm stability.  2. Stable small splenic artery aneurysms in the mid splenic artery (region of the pancreatic body) and splenic hilum.  3. One year stability of 6 mm right lower lobe pulmonary nodule. Recommend continued attention on annual follow-up imaging.  4. Additional ancillary  findings as above without significant interval change.  Based on Dr. Lianne Moris review of this patient CTA, his CA aneurysm is unchanged with diameter ~1.4 mm. An orthogonal measurement would be necessary to be completely accurate, which is not possible without 3D recon. There are  two small splenic aneurysms (<2 cm).  09/02/2012 Carotid Duplex: PEAK SYSTOLIC/END DIASTOLIC  RIGHT  ICA: 74/27cm/sec  CCA: 872/76BO/MQT  SYSTOLIC ICA/CCA RATIO: 0.5  DIASTOLIC ICA/CCA RATIO: 9.27  ECA: 101cm/sec  LEFT  ICA: 91/31cm/sec  CCA: 639/43QW/QVL  SYSTOLIC ICA/CCA RATIO: 9.44  DIASTOLIC ICA/CCA RATIO: 0.6  ECA: 125cm/sec  Findings:  RIGHT CAROTID ARTERY: There is a very minimal amount of eccentric  echogenic plaque within the right carotid bulb (image 15), not  resulting elevated peak systolic velocity in the right internal  carotid artery to suggest hemodynamically significant stenosis.  RIGHT VERTEBRAL ARTERY: Antegrade flow  LEFT CAROTID ARTERY: There is a minimal amount of intimal wall  thickening and eccentric echogenic plaque within the left carotid  bulb (image 54), not resulting in elevated peak systolic velocities  within the left internal carotid artery to suggest a  hemodynamically significant stenosis.  LEFT VERTEBRAL ARTERY: Antegrade flow  IMPRESSION:  Minimal amount of intimal wall thickening and atherosclerotic  plaque within the bilateral carotid bulbs, right subjectively  greater than left, not resulting in hemodynamically significant  stenosis.   Medical Decision Making  Leonard Noble is a 63 y.o. male who presents with follow up for celiac and splenic aneurysm. 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 The patient denies any back or abdominal pain. He has complete resolution of his prior abdominal pain. He denies any hematemesis, hematochezia, or melena. The patient is not a smoker.  The patient denies food fear.   Dr. Lianne Moris assessment from  05/30/2013: The patient is completely asx at this point with minimal change in celiac aneurysm size. There is controversy in the literature in regards management of celiac artery aneurysm due to the limited number of such in any center. I discussed the risk of non-operative management including rupture and subsequent death, but we agreed that at this point, there is nothing forcing [place] to proceed with surgical intervention on his celiac aneurysm. The two splenic aneurysms are too small to require any immediate intervention. We will continued with q6 mesenteric duplex and repeat the CTA in 2 years, August, 2016, if the duplex is stable. This is to limit the radiation risks of the CTA.    Based on her exam and studies, and after discussing with Dr. Bridgett Larsson, I have offered the patient every 6 months mesenteric duplex.  I discussed in depth with the patient the nature of atherosclerosis, and emphasized the importance of maximal medical management including strict control of blood pressure, blood glucose, and lipid levels, obtaining regular exercise, and cessation of smoking.    The patient is aware that without maximal medical management the underlying atherosclerotic disease process will progress, limiting the benefit of any interventions. The patient is currently on a statin:Crestor 5 mg.     The patient is currently  on an anti-platelet: ASA 81 mg PO daily.  Thank you for allowing [place] to participate in this patient's care.  Clemon Chambers, RN, MSN, FNP-C [facility] Office: [phone]  Clinic MD: Trula Slade  07/20/2014, 9:44 AM

## 2014-09-04 ENCOUNTER — Other Ambulatory Visit: Payer: Self-pay | Admitting: Gastroenterology

## 2014-09-04 ENCOUNTER — Ambulatory Visit
Admission: RE | Admit: 2014-09-04 | Discharge: 2014-09-04 | Disposition: A | Discharge status: Home / Self Care | Payer: 59 | Source: Ambulatory Visit | Attending: Gastroenterology | Admitting: Gastroenterology

## 2014-09-04 DIAGNOSIS — K5909 Other constipation: Secondary | ICD-10-CM

## 2014-09-04 DIAGNOSIS — R109 Unspecified abdominal pain: Secondary | ICD-10-CM

## 2015-01-25 ENCOUNTER — Other Ambulatory Visit (HOSPITAL_COMMUNITY): Payer: 59

## 2015-01-25 ENCOUNTER — Ambulatory Visit: Payer: 59 | Admitting: Family

## 2015-02-02 ENCOUNTER — Other Ambulatory Visit (HOSPITAL_COMMUNITY): Payer: 59

## 2015-02-02 ENCOUNTER — Ambulatory Visit: Payer: 59 | Admitting: Family

## 2015-03-11 ENCOUNTER — Encounter: Payer: Self-pay | Admitting: Vascular Surgery

## 2015-03-12 ENCOUNTER — Ambulatory Visit (INDEPENDENT_AMBULATORY_CARE_PROVIDER_SITE_OTHER): Payer: 59 | Admitting: Vascular Surgery

## 2015-03-12 ENCOUNTER — Ambulatory Visit (HOSPITAL_COMMUNITY)
Admission: RE | Admit: 2015-03-12 | Discharge: 2015-03-12 | Disposition: A | Discharge status: Home / Self Care | Payer: 59 | Source: Ambulatory Visit | Attending: Vascular Surgery | Admitting: Vascular Surgery

## 2015-03-12 ENCOUNTER — Encounter: Payer: Self-pay | Admitting: Vascular Surgery

## 2015-03-12 VITALS — BP 120/68 | HR 61 | Ht 73.0 in | Wt 182.5 lb

## 2015-03-12 DIAGNOSIS — I728 Aneurysm of other specified arteries: Secondary | ICD-10-CM | POA: Diagnosis not present

## 2015-03-12 NOTE — Progress Notes (Signed)
    Established Celiac Artery Aneurysm   History of Present Illness  Leonard Noble is a 64 y.o. ([birth year]) male who presents with chief complaint: constipation.  The patient notes abdominal pain related to intermittent constipation > diarrhea.  The abdominal pain is associated with: irregular bowel habits.  The patient notes no food fear.  The patient notes no weight loss.  He denies any hematochezia, melena or emesis.  The patient's PMH, PSH, SH, FamHx, Med, and Allergies are unchanged from 07/20/14.  On ROS today: see above   Physical Examination  Filed Vitals:   03/12/15 1019  BP: 120/68  Pulse: 61  Height: 6\' 1"  (1.854 m)  Weight: 182 lb 8 oz (82.781 kg)  SpO2: 100%   Body mass index is 24.08 kg/(m^2).  General: A&O x 3, WDWN  Pulmonary: Sym exp, good air movt, CTAB, no rales, rhonchi, & wheezing  Cardiac: RRR, Nl S1, S2, no Murmurs, rubs or gallops  Vascular: Vessel Right Left  Radial Palpable Palpable  Brachial Palpable Palpable  Carotid Palpable, without bruit Palpable, without bruit  Aorta Not palpable N/A  Femoral Palpable Palpable  Popliteal Not palpable, no aneurysm palp Not palpable, no aneurysm palp  PT Faintly Palpable Faintly Palpable  DP Faintly Palpable Faintly Palpable   Gastrointestinal: soft, NTND, -G/R, - HSM, - masses, - CVAT B, no AAA palpable  Musculoskeletal: M/S 5/5 throughout , Extremities without ischemic changes   Neurologic: Pain and light touch intact in extremities , Motor exam as listed above  Non-Invasive Vascular Imaging  Mesenteric Duplex (Date: 03/12/2015):   Ao: 110  Celiac artery: 198 c/s , 1.63 cm x 1.88 cm  SMA: 266 c/s  IMA: 259 c/s  No comments on splenic aneurysms  Medical Decision Making  Leonard Noble is a 64 y.o. male who presents with: chronic asx celiac artery aneurysm, known small SAA x 2   There has been no significant change in the last 3 years, so would continue surveillance.  I doubt this patient's sx are  related to this celiac artery aneurysm.  Based on her exam and studies, I have offered the patient annual celiac artery aneurysm.  I discussed in depth with the patient the nature of atherosclerosis, and emphasized the importance of maximal medical management including strict control of blood pressure, blood glucose, and lipid levels, obtaining regular exercise, and cessation of smoking.    The patient is aware that without maximal medical management the underlying atherosclerotic disease process will progress, limiting the benefit of any interventions. The patient is currently on a statin: Crestor. The patient is currently on an anti-platelet: ASA.  Thank you for allowing [place] to participate in this patient's care.  Adele Barthel, MD [facility] Office: [phone] Pager: [phone]  03/12/2015, 10:42 AM

## 2015-03-15 NOTE — Addendum Note (Signed)
Addended by: Reola Calkins on: 03/15/2015 04:06 PM   Modules accepted: Orders

## 2015-04-09 ENCOUNTER — Other Ambulatory Visit: Payer: Self-pay | Admitting: Gastroenterology

## 2015-04-09 DIAGNOSIS — R1010 Upper abdominal pain, unspecified: Secondary | ICD-10-CM

## 2015-04-16 ENCOUNTER — Ambulatory Visit
Admission: RE | Admit: 2015-04-16 | Discharge: 2015-04-16 | Disposition: A | Discharge status: Home / Self Care | Payer: 59 | Source: Ambulatory Visit | Attending: Gastroenterology | Admitting: Gastroenterology

## 2015-04-16 DIAGNOSIS — R1010 Upper abdominal pain, unspecified: Secondary | ICD-10-CM

## 2015-07-17 ENCOUNTER — Encounter (HOSPITAL_COMMUNITY)
Admission: EM | Disposition: A | Discharge status: Home / Self Care | Payer: Self-pay | Source: Home / Self Care | Attending: Emergency Medicine

## 2015-07-17 ENCOUNTER — Encounter (HOSPITAL_COMMUNITY): Payer: Self-pay

## 2015-07-17 ENCOUNTER — Emergency Department (HOSPITAL_COMMUNITY): Payer: 59

## 2015-07-17 ENCOUNTER — Observation Stay (HOSPITAL_COMMUNITY)
Admission: EM | Admit: 2015-07-17 | Discharge: 2015-07-19 | Disposition: A | Discharge status: Home / Self Care | Payer: 59 | Attending: Cardiovascular Disease | Admitting: Cardiovascular Disease

## 2015-07-17 ENCOUNTER — Other Ambulatory Visit (HOSPITAL_COMMUNITY): Payer: 59

## 2015-07-17 DIAGNOSIS — Z7982 Long term (current) use of aspirin: Secondary | ICD-10-CM | POA: Diagnosis not present

## 2015-07-17 DIAGNOSIS — Z23 Encounter for immunization: Secondary | ICD-10-CM | POA: Insufficient documentation

## 2015-07-17 DIAGNOSIS — R7989 Other specified abnormal findings of blood chemistry: Secondary | ICD-10-CM | POA: Insufficient documentation

## 2015-07-17 DIAGNOSIS — K219 Gastro-esophageal reflux disease without esophagitis: Secondary | ICD-10-CM | POA: Diagnosis not present

## 2015-07-17 DIAGNOSIS — I319 Disease of pericardium, unspecified: Secondary | ICD-10-CM | POA: Diagnosis present

## 2015-07-17 DIAGNOSIS — R1010 Upper abdominal pain, unspecified: Secondary | ICD-10-CM | POA: Diagnosis present

## 2015-07-17 DIAGNOSIS — R0602 Shortness of breath: Secondary | ICD-10-CM | POA: Diagnosis not present

## 2015-07-17 DIAGNOSIS — R9431 Abnormal electrocardiogram [ECG] [EKG]: Secondary | ICD-10-CM | POA: Diagnosis not present

## 2015-07-17 DIAGNOSIS — Z792 Long term (current) use of antibiotics: Secondary | ICD-10-CM | POA: Insufficient documentation

## 2015-07-17 DIAGNOSIS — R0789 Other chest pain: Secondary | ICD-10-CM

## 2015-07-17 DIAGNOSIS — R911 Solitary pulmonary nodule: Secondary | ICD-10-CM | POA: Insufficient documentation

## 2015-07-17 DIAGNOSIS — I309 Acute pericarditis, unspecified: Secondary | ICD-10-CM | POA: Diagnosis not present

## 2015-07-17 DIAGNOSIS — I728 Aneurysm of other specified arteries: Secondary | ICD-10-CM | POA: Insufficient documentation

## 2015-07-17 DIAGNOSIS — L723 Sebaceous cyst: Secondary | ICD-10-CM | POA: Diagnosis not present

## 2015-07-17 DIAGNOSIS — R778 Other specified abnormalities of plasma proteins: Secondary | ICD-10-CM | POA: Insufficient documentation

## 2015-07-17 DIAGNOSIS — R739 Hyperglycemia, unspecified: Secondary | ICD-10-CM | POA: Diagnosis not present

## 2015-07-17 DIAGNOSIS — Z79899 Other long term (current) drug therapy: Secondary | ICD-10-CM | POA: Insufficient documentation

## 2015-07-17 DIAGNOSIS — R509 Fever, unspecified: Secondary | ICD-10-CM | POA: Insufficient documentation

## 2015-07-17 DIAGNOSIS — R079 Chest pain, unspecified: Secondary | ICD-10-CM

## 2015-07-17 DIAGNOSIS — K589 Irritable bowel syndrome without diarrhea: Secondary | ICD-10-CM | POA: Diagnosis not present

## 2015-07-17 HISTORY — PX: CARDIAC CATHETERIZATION: SHX172

## 2015-07-17 LAB — TROPONIN I
TROPONIN I: 5.04 ng/mL — AB (ref <0.031)
Troponin I: 7.17 ng/mL (ref <0.031)
Troponin I: 9.47 ng/mL (ref <0.031)
Troponin I: 6.68 ng/mL (ref <0.031)

## 2015-07-17 LAB — CBC
HCT: 41.4 % (ref 39.0–52.0)
HEMATOCRIT: 42.2 % (ref 39.0–52.0)
Hemoglobin: 14.8 g/dL (ref 13.0–17.0)
Hemoglobin: 14.3 g/dL (ref 13.0–17.0)
MCH: 31.6 pg (ref 26.0–34.0)
MCH: 31.2 pg (ref 26.0–34.0)
MCHC: 35.1 g/dL (ref 30.0–36.0)
MCHC: 34.5 g/dL (ref 30.0–36.0)
MCV: 90 fL (ref 78.0–100.0)
MCV: 90.4 fL (ref 78.0–100.0)
PLATELETS: 183 10*3/uL (ref 150–400)
PLATELETS: 172 10*3/uL (ref 150–400)
RBC: 4.69 MIL/uL (ref 4.22–5.81)
RBC: 4.58 MIL/uL (ref 4.22–5.81)
RDW: 13 % (ref 11.5–15.5)
RDW: 13 % (ref 11.5–15.5)
WBC: 9.1 10*3/uL (ref 4.0–10.5)
WBC: 7.6 10*3/uL (ref 4.0–10.5)

## 2015-07-17 LAB — BASIC METABOLIC PANEL
Anion gap: 11 (ref 5–15)
BUN: 10 mg/dL (ref 6–20)
CO2: 24 mmol/L (ref 22–32)
CREATININE: 1.15 mg/dL (ref 0.61–1.24)
Calcium: 9.1 mg/dL (ref 8.9–10.3)
Chloride: 98 mmol/L — ABNORMAL LOW (ref 101–111)
Glucose, Bld: 143 mg/dL — ABNORMAL HIGH (ref 65–99)
POTASSIUM: 4.3 mmol/L (ref 3.5–5.1)
SODIUM: 133 mmol/L — AB (ref 135–145)

## 2015-07-17 LAB — CREATININE, SERUM
CREATININE: 1 mg/dL (ref 0.61–1.24)
GFR calc non Af Amer: >60 mL/min (ref >60)

## 2015-07-17 LAB — PROTIME-INR
INR: 1.15 (ref 0.00–1.49)
PROTHROMBIN TIME: 14.9 s (ref 11.6–15.2)

## 2015-07-17 LAB — I-STAT TROPONIN, ED: Troponin i, poc: 5.51 ng/mL (ref 0.00–0.08)

## 2015-07-17 LAB — D-DIMER, QUANTITATIVE (NOT AT ARMC): D DIMER QUANT: 0.81 ug{FEU}/mL — AB (ref 0.00–0.48)

## 2015-07-17 SURGERY — LEFT HEART CATH AND CORONARY ANGIOGRAPHY

## 2015-07-17 MED ORDER — ASPIRIN 81 MG PO TABS: 81.0000 mg | ORAL_TABLET | Freq: Every day | ORAL | Status: DC

## 2015-07-17 MED ORDER — HEPARIN SODIUM (PORCINE) 5000 UNIT/ML IJ SOLN
5000.0000 [IU] | Freq: Three times a day (TID) | INTRAMUSCULAR | Status: DC
  Administered 2015-07-17 – 2015-07-18 (×4): 5000 [IU] via SUBCUTANEOUS
  Filled 2015-07-17 (×5): qty 1

## 2015-07-17 MED ORDER — ASPIRIN 81 MG PO CHEW
81.0000 mg | CHEWABLE_TABLET | Freq: Every day | ORAL | Status: DC
  Administered 2015-07-17 – 2015-07-19 (×3): 81 mg via ORAL
  Filled 2015-07-17 (×3): qty 1

## 2015-07-17 MED ORDER — SODIUM CHLORIDE 0.9 % IV BOLUS (SEPSIS)
250.0000 mL | Freq: Once | INTRAVENOUS | Status: AC
  Administered 2015-07-17: 250 mL via INTRAVENOUS

## 2015-07-17 MED ORDER — SULFAMETHOXAZOLE-TRIMETHOPRIM 800-160 MG PO TABS
1.0000 | ORAL_TABLET | Freq: Two times a day (BID) | ORAL | Status: DC
  Administered 2015-07-17 – 2015-07-18 (×3): 1 via ORAL
  Filled 2015-07-17 (×5): qty 1

## 2015-07-17 MED ORDER — CO Q-10 100 MG PO CAPS: 100.0000 mg | ORAL_CAPSULE | Freq: Every day | ORAL | Status: DC

## 2015-07-17 MED ORDER — ROSUVASTATIN CALCIUM 10 MG PO TABS
5.0000 mg | ORAL_TABLET | ORAL | Status: DC
  Administered 2015-07-17 – 2015-07-19 (×2): 5 mg via ORAL
  Filled 2015-07-17 (×3): qty 1

## 2015-07-17 MED ORDER — ONDANSETRON HCL 4 MG/2ML IJ SOLN: 4.0000 mg | Freq: Four times a day (QID) | INTRAMUSCULAR | Status: DC | PRN

## 2015-07-17 MED ORDER — COLCHICINE 0.6 MG PO TABS
0.6000 mg | ORAL_TABLET | Freq: Two times a day (BID) | ORAL | Status: DC
  Administered 2015-07-17 – 2015-07-19 (×4): 0.6 mg via ORAL
  Filled 2015-07-17 (×5): qty 1

## 2015-07-17 MED ORDER — VERAPAMIL HCL 2.5 MG/ML IV SOLN
INTRAVENOUS | Status: DC | PRN
  Administered 2015-07-17: 06:00:00 via INTRA_ARTERIAL

## 2015-07-17 MED ORDER — MIDAZOLAM HCL 2 MG/2ML IJ SOLN
INTRAMUSCULAR | Status: AC
  Filled 2015-07-17: qty 4

## 2015-07-17 MED ORDER — POLYETHYLENE GLYCOL 3350 17 GM/SCOOP PO POWD: 17.0000 g | Freq: Every day | ORAL | Status: DC | PRN

## 2015-07-17 MED ORDER — POLYETHYLENE GLYCOL 3350 17 G PO PACK
17.0000 g | PACK | Freq: Every day | ORAL | Status: DC | PRN
  Administered 2015-07-17 – 2015-07-18 (×2): 17 g via ORAL
  Filled 2015-07-17 (×2): qty 1

## 2015-07-17 MED ORDER — HEPARIN SODIUM (PORCINE) 1000 UNIT/ML IJ SOLN
INTRAMUSCULAR | Status: DC | PRN
  Administered 2015-07-17: 4000 [IU] via INTRAVENOUS

## 2015-07-17 MED ORDER — SODIUM CHLORIDE 0.9 % IJ SOLN: 3.0000 mL | INTRAMUSCULAR | Status: DC | PRN

## 2015-07-17 MED ORDER — SODIUM CHLORIDE 0.9 % IV SOLN: 250.0000 mL | INTRAVENOUS | Status: DC | PRN

## 2015-07-17 MED ORDER — FENTANYL CITRATE (PF) 100 MCG/2ML IJ SOLN
INTRAMUSCULAR | Status: DC | PRN
  Administered 2015-07-17: 25 ug via INTRAVENOUS

## 2015-07-17 MED ORDER — MORPHINE SULFATE (PF) 2 MG/ML IV SOLN: 2.0000 mg | INTRAVENOUS | Status: DC | PRN

## 2015-07-17 MED ORDER — ONE-DAILY MULTI VITAMINS PO TABS: 1.0000 | ORAL_TABLET | Freq: Every day | ORAL | Status: DC

## 2015-07-17 MED ORDER — ACETAMINOPHEN 325 MG PO TABS: 650.0000 mg | ORAL_TABLET | ORAL | Status: DC | PRN

## 2015-07-17 MED ORDER — HEPARIN (PORCINE) IN NACL 2-0.9 UNIT/ML-% IJ SOLN
INTRAMUSCULAR | Status: AC
  Filled 2015-07-17: qty 1500

## 2015-07-17 MED ORDER — ACETAMINOPHEN 325 MG PO TABS
650.0000 mg | ORAL_TABLET | ORAL | Status: DC | PRN
  Administered 2015-07-17: 650 mg via ORAL
  Filled 2015-07-17: qty 2

## 2015-07-17 MED ORDER — LIDOCAINE HCL (PF) 1 % IJ SOLN
INTRAMUSCULAR | Status: DC | PRN
  Administered 2015-07-17: 07:00:00

## 2015-07-17 MED ORDER — IBUPROFEN 600 MG PO TABS
600.0000 mg | ORAL_TABLET | Freq: Four times a day (QID) | ORAL | Status: DC
  Administered 2015-07-17 – 2015-07-19 (×10): 600 mg via ORAL
  Filled 2015-07-17 (×10): qty 1

## 2015-07-17 MED ORDER — IOHEXOL 350 MG/ML SOLN
100.0000 mL | Freq: Once | INTRAVENOUS | Status: AC | PRN
  Administered 2015-07-17: 100 mL via INTRAVENOUS

## 2015-07-17 MED ORDER — MIDAZOLAM HCL 2 MG/2ML IJ SOLN
INTRAMUSCULAR | Status: DC | PRN
  Administered 2015-07-17: 2 mg via INTRAVENOUS

## 2015-07-17 MED ORDER — SODIUM CHLORIDE 0.9 % IJ SOLN
3.0000 mL | Freq: Two times a day (BID) | INTRAMUSCULAR | Status: DC
  Administered 2015-07-17 – 2015-07-19 (×4): 3 mL via INTRAVENOUS

## 2015-07-17 MED ORDER — LIDOCAINE HCL (PF) 1 % IJ SOLN
INTRAMUSCULAR | Status: AC
  Filled 2015-07-17: qty 30

## 2015-07-17 MED ORDER — SODIUM CHLORIDE 0.9 % WEIGHT BASED INFUSION
3.0000 mL/kg/h | INTRAVENOUS | Status: AC
Start: 2015-07-17 — End: 2015-07-17
  Administered 2015-07-17: 3 mL/kg/h via INTRAVENOUS

## 2015-07-17 MED ORDER — SUCRALFATE 1 G PO TABS
1.0000 g | ORAL_TABLET | Freq: Three times a day (TID) | ORAL | Status: DC
  Administered 2015-07-17 – 2015-07-19 (×11): 1 g via ORAL
  Filled 2015-07-17 (×11): qty 1

## 2015-07-17 MED ORDER — IOHEXOL 350 MG/ML SOLN
INTRAVENOUS | Status: DC | PRN
  Administered 2015-07-17: 55 mL via INTRA_ARTERIAL

## 2015-07-17 MED ORDER — FENTANYL CITRATE (PF) 100 MCG/2ML IJ SOLN
50.0000 ug | Freq: Once | INTRAMUSCULAR | Status: AC
  Administered 2015-07-17: 50 ug via INTRAVENOUS
  Filled 2015-07-17: qty 2

## 2015-07-17 MED ORDER — EZETIMIBE 10 MG PO TABS
10.0000 mg | ORAL_TABLET | Freq: Every day | ORAL | Status: DC
  Administered 2015-07-17 – 2015-07-19 (×3): 10 mg via ORAL
  Filled 2015-07-17 (×3): qty 1

## 2015-07-17 MED ORDER — FENTANYL CITRATE (PF) 100 MCG/2ML IJ SOLN
INTRAMUSCULAR | Status: AC
  Filled 2015-07-17: qty 4

## 2015-07-17 MED ORDER — INFLUENZA VAC SPLIT QUAD 0.5 ML IM SUSY
0.5000 mL | PREFILLED_SYRINGE | INTRAMUSCULAR | Status: AC
  Administered 2015-07-18: 0.5 mL via INTRAMUSCULAR
  Filled 2015-07-17: qty 0.5

## 2015-07-17 MED ORDER — PANTOPRAZOLE SODIUM 20 MG PO TBEC
20.0000 mg | DELAYED_RELEASE_TABLET | Freq: Every day | ORAL | Status: DC
  Administered 2015-07-17 – 2015-07-19 (×3): 20 mg via ORAL
  Filled 2015-07-17 (×5): qty 1

## 2015-07-17 MED ORDER — HEPARIN SODIUM (PORCINE) 5000 UNIT/ML IJ SOLN: 5000.0000 [IU] | Freq: Three times a day (TID) | INTRAMUSCULAR | Status: DC

## 2015-07-17 MED ORDER — ADULT MULTIVITAMIN W/MINERALS CH
1.0000 | ORAL_TABLET | Freq: Every day | ORAL | Status: DC
  Administered 2015-07-17 – 2015-07-19 (×3): 1 via ORAL
  Filled 2015-07-17 (×3): qty 1

## 2015-07-17 MED ORDER — VERAPAMIL HCL 2.5 MG/ML IV SOLN
INTRAVENOUS | Status: AC
  Filled 2015-07-17: qty 2

## 2015-07-17 MED ORDER — HEPARIN SODIUM (PORCINE) 1000 UNIT/ML IJ SOLN
INTRAMUSCULAR | Status: AC
  Filled 2015-07-17: qty 1

## 2015-07-17 SURGICAL SUPPLY — 13 items
CATH INFINITI 5 FR JL3.5 (CATHETERS) ×3 IMPLANT
CATH INFINITI 5FR MULTPACK ANG (CATHETERS) ×2 IMPLANT
ELECT DEFIB PAD ADLT CADENCE (PAD) ×2 IMPLANT
GLIDESHEATH SLEND SS 6F .021 (SHEATH) ×3 IMPLANT
KIT ENCORE 26 ADVANTAGE (KITS) ×2 IMPLANT
KIT HEART LEFT (KITS) ×3 IMPLANT
PACK CARDIAC CATHETERIZATION (CUSTOM PROCEDURE TRAY) ×3 IMPLANT
SHEATH PINNACLE 6F 10CM (SHEATH) IMPLANT
SYR MEDRAD MARK V 150ML (SYRINGE) ×3 IMPLANT
TRANSDUCER W/STOPCOCK (MISCELLANEOUS) ×3 IMPLANT
TUBING CIL FLEX 10 FLL-RA (TUBING) ×2 IMPLANT
WIRE EMERALD 3MM-J .035X150CM (WIRE) ×1 IMPLANT
WIRE SAFE-T 1.5MM-J .035X260CM (WIRE) ×3 IMPLANT

## 2015-07-17 NOTE — Progress Notes (Signed)
CRITICAL VALUE ALERT  Critical value received:  Troponin 9.47  Date of notification 07/17/2015  Time of notification:  0945  Critical value read back: yes  Nurse who received alert:  Shelba Flake, RN  MD notified (1st page):  Dr. Doylene Canard  Time of first page:  09:48   Responding MD:  Dr. Doylene Canard  Time MD responded:  09:48

## 2015-07-17 NOTE — ED Notes (Signed)
X ray in room.

## 2015-07-17 NOTE — ED Notes (Signed)
Pt arrived by POV, pt states that he has been taking miralax as needed and he has taken it for the last 5 days twice per day. Pt complains of constipation x 1 week. Pt states that he has not had a normal bowel movement x 1 week and started getting sharp stabbing pain in his abd around 0200 this morning. Pt denies n/v

## 2015-07-17 NOTE — ED Notes (Signed)
Cardiologist wanted to see pt in ED prior to cath lab. Pt taken back to room A6 by this RN, cardiology in room with pt at this time.

## 2015-07-17 NOTE — ED Provider Notes (Signed)
CSN: [id number]     Arrival date & time 07/17/15  0418 History   First MD Initiated Contact with Patient 07/17/15 0422     Chief Complaint  Patient presents with  . Abdominal Pain  . Constipation     (Consider location/radiation/quality/duration/timing/severity/associated sxs/prior Treatment) HPI Patient presents with lower abdominal pain, upper abdominal pain, nausea. Symptoms began about 5 days ago. Since onset symptoms of persistent. No relief with laxatives, the patient notes ongoing decreased bowel movements. Today, the pain became more severe, persistent. There is ongoing mild dyspnea, nausea, fatigue. No lightheadedness, syncope. No asymmetric weakness, or loss of sensation. 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 Patient recently started taking antibiotics for cutaneous condition, that is unremarkable today.  Past Medical History  Diagnosis Date  . SOB (shortness of breath) on exertion   . IBS (irritable bowel syndrome)   . H/O prostatitis   . Pulmonary nodule   . H/O asbestos exposure   . GERD (gastroesophageal reflux disease)   . Celiac artery aneurysm    Past Surgical History  Procedure Laterality Date  . Hernia repair  1994    right   . Cervical spine surgery  2012    C-Spine - Neck  . Spine surgery    . Eye surgery Left Feb. 2014    Muscle correction   Family History  Problem Relation Age of Onset  . Cancer Mother     Multiple Myaloma  . Diabetes Father   . Heart attack Brother     Blood Leg- Leg  . Heart disease Brother   . Hypertension Brother    Social History  Substance Use Topics  . Smoking status: Never Smoker   . Smokeless tobacco: Never Used  . Alcohol Use: 1.2 oz/week    2 Glasses of wine per week    Review of Systems  Constitutional:       Per HPI, otherwise negative  HENT:       Per HPI, otherwise negative  Respiratory:       Per HPI, otherwise negative    Cardiovascular:       Per HPI, otherwise negative  Gastrointestinal: Positive for nausea and abdominal pain. Negative for vomiting.  Endocrine:       Negative aside from HPI  Genitourinary:       Neg aside from HPI   Musculoskeletal:       Per HPI, otherwise negative  Skin: Negative.   Neurological: Negative for syncope.      Allergies  Epinephrine  Home Medications   Prior to Admission medications   Medication Sig Start Date End Date Taking? Authorizing Provider  amoxicillin (AMOXIL) 500 MG tablet Take 500 mg by mouth 2 (two) times daily.    Historical Provider, MD  aspirin 81 MG tablet Take 81 mg by mouth daily.    Historical Provider, MD  clarithromycin (BIAXIN) 500 MG tablet Take 500 mg by mouth Twice daily. 05/22/12   Historical Provider, MD  Coenzyme Q10 (CO Q-10) 100 MG CAPS Take by mouth daily.    Historical Provider, MD  esomeprazole (NEXIUM) 40 MG capsule Take 1 capsule (40 mg total) by mouth daily. 01/23/11 01/23/12  Collene Gobble, MD  esomeprazole (NEXIUM) 40 MG capsule Take 40 mg by mouth daily before breakfast.    Historical Provider, MD  ezetimibe (ZETIA) 10 MG tablet Take 10 mg by mouth daily.    Historical Provider, MD  lansoprazole (PREVACID) 30 MG  capsule Take 30 mg by mouth daily.  03/29/13   Historical Provider, MD  mometasone (NASONEX) 50 MCG/ACT nasal spray Place 2 sprays into the nose daily.    Historical Provider, MD  Multiple Vitamin (MULTIVITAMIN) tablet Take 1 tablet by mouth daily.    Historical Provider, MD  polyethylene glycol powder (GLYCOLAX/MIRALAX) powder Take 17 g by mouth as needed. 01/22/15   Historical Provider, MD  pravastatin (PRAVACHOL) 40 MG tablet Take 40 mg by mouth daily.    Historical Provider, MD  Probiotic Product (PROBIOTIC DAILY PO) Take by mouth as needed.     Historical Provider, MD  rosuvastatin (CRESTOR) 5 MG tablet Take 5 mg by mouth every other day.    Historical Provider, MD  terazosin (HYTRIN) 5 MG capsule Take 1 capsule by  mouth daily. 03/02/15   Historical Provider, MD   BP 121/72 mmHg  Pulse 67  Temp(Src) 98.1 F (36.7 C) (Oral)  Resp 17  SpO2 97% Physical Exam  Constitutional: He is oriented to person, place, and time. He appears well-developed. He appears ill.  HENT:  Head: Normocephalic and atraumatic.  Eyes: Conjunctivae and EOM are normal.  Cardiovascular: Normal rate and regular rhythm.   Pulses in both upper extremity is symmetric, appreciable.   Pulmonary/Chest: Effort normal. No stridor. No respiratory distress.  Abdominal: He exhibits no distension.    Musculoskeletal: He exhibits no edema.  Neurological: He is alert and oriented to person, place, and time.  Skin: Skin is warm and dry.  Psychiatric: He has a normal mood and affect.  Nursing note and vitals reviewed.   ED Course  Procedures (including critical care time) Labs Review Labs Reviewed  BASIC METABOLIC PANEL  CBC  TROPONIN I  PROTIME-INR  D-DIMER, QUANTITATIVE (NOT AT [facility])    Imaging Review Dg Chest Port 1 View  07/17/2015   CLINICAL DATA:  Chest pain tonight.  EXAM: PORTABLE CHEST 1 VIEW  COMPARISON:  11/22/2012  FINDINGS: Hyperinflation. No focal airspace disease or consolidation. Normal heart size and pulmonary vascularity. No blunting of costophrenic angles. No pneumothorax. Postoperative changes in the cervical spine.  IMPRESSION: No active disease.   Electronically Signed   By: Lucienne Capers M.D.   On: 07/17/2015 04:57   I have personally reviewed and evaluated these images and lab results as part of my medical decision-making.   EKG Interpretation   Date/Time:  Saturday July 17 2015 04:24:57 EDT Ventricular Rate:  72 PR Interval:  156 QRS Duration: 92 QT Interval:  383 QTC Calculation: 419 R Axis:   79 Text Interpretation:  Sinus rhythm Probable left atrial enlargement  Inferior infarct, acute (LCx) Lateral leads are also involved Sinus rhythm  Artifact ST-t wave abnormality Abnormal ekg  Confirmed by Carmin Muskrat   MD [phone]) on 07/17/2015 4:35:20 AM      Immediately after the initial evaluation I paged our cardiology colleagues for evaluation as well.   Chart review demonstrates history of celiac artery aneurysm.  Prior catheterization results not immediately available, patient reports normal catheterization within the past 5 years.   5:01 AM Awaiting cardiology call back.  Chart review demonstrates mesenteric artery aneurysm imaged recently with 2.1 x 1.1 cm dimension.  No notation of the patient's recent catheterization.  I reviewed the portal x-ray, agree with the interpretation.   Patient's initial troponin is 5.5. On repeat exam the patient continues to have pain, remains hemodynamically stable. Fentanyl provided. With concern for dissection versus MI, versus pericarditis, and discussed patient's case with  cardiology. The patient's primary cardiology team and I discussed this presentation, and again I discussed it with our on-site practitioners. With some concern for dissection, given elevated d-dimer, troponin, pain throughout the abdomen, and in the back, as well as known aneurysm, patient will have emergent CTA, while the catheterization team is assembling.    Update: I reviewed the CT imaging myself, discussed him with our radiology colleagues. There is no evidence for dissection.  Discussed patient's case again with our cardiology team, the patient will be taken to the catheterization lab.   MDM  Patient presents with concern of abdominal pain upper, lower, back pain, mild dyspnea, fatigue. Patient is diaphoretic, but hemodynamically stable initially. With this patient's history of aneurysm, dissection versus rupture was immediately consideration, in addition to dissection versus pericarditis. Patient's labs were notable for elevated troponin, and with the aforementioned concerns, patient had emergency CT angiography. Patient's case was conducted  with our cardiology colleagues. Patient pain control with fentanyl. With elevated troponin, ongoing pain, concern for ongoing coronary ischemia, the patient was taken expeditiously to the cardiac catheterization lab.   CRITICAL CARE Performed by: Carmin Muskrat Total critical care time: 45 Critical care time was exclusive of separately billable procedures and treating other patients. Critical care was necessary to treat or prevent imminent or life-threatening deterioration. Critical care was time spent personally by me on the following activities: development of treatment plan with patient and/or surrogate as well as nursing, discussions with consultants, evaluation of patient's response to treatment, examination of patient, obtaining history from patient or surrogate, ordering and performing treatments and interventions, ordering and review of laboratory studies, ordering and review of radiographic studies, pulse oximetry and re-evaluation of patient's condition.    Carmin Muskrat, MD 07/17/15 [id number]

## 2015-07-17 NOTE — ED Notes (Signed)
MD in room

## 2015-07-17 NOTE — ED Notes (Signed)
Cath lab called and they are ready for pt. This RN will take the pt up to cath lab immediately following ct

## 2015-07-17 NOTE — ED Notes (Signed)
Lab called to give critical troponin of 7.17

## 2015-07-17 NOTE — Progress Notes (Signed)
   07/17/15 0510  Clinical Encounter Type  Visited With Family  Visit Type Spiritual support  Referral From Other (Comment) (Page from [facility])  Stress Factors  Family Stress Factors Lack of knowledge  Code Stemi at 19, called desk, no info; called back charge nurse 5 minutes later, still no info. Arrived 0515, introduced self to patient's wife; patient had been moved to [place]; brought wife water, told her to have nurse call if she needed anything. Wife had called daughter with news.

## 2015-07-17 NOTE — H&P (Signed)
CARDIOLOGY ADMISSION NOTE  Patient ID: Leonard Noble MRN: [id number] DOB/AGE: 29-May-1951 64 y.o.  Admit date: 07/17/2015 Primary Physician   Antony Blackbird, MD Primary Cardiologist   Dr. Terrence Dupont  Chief Complaint    Chest pain  HPI:  The patient presents with chest pain.  He has a history of cardiac cath in 2011 by Dr. Terrence Dupont.  He had a 10% LAD lesion.  He does have a history of PVD with previous aneurysm of the splenic artery.  He reports that he has had constipation.  This has been going on for a few days.  He reports abdominal pain.  He subsequently developed pain in his lumbar back.  Then he started having pain in his shoulders.  This morning at 2:30 AM he had a sensation like he could not burp.  He had a "gas" like sensation in his upper chest.  It woke him from his sleep.  He did not have radiation to the neck or arm.  There was no N/V but there was some SOB.    In the ED his EKG demonstrated diffuse ST elevation in inferior and lateral leads.  Troponin was 7.17.  CT of the chest did not show dissection or effusion.  Pain improved with fentanyl.    Past Medical History  Diagnosis Date  . SOB (shortness of breath) on exertion   . IBS (irritable bowel syndrome)   . H/O prostatitis   . Pulmonary nodule   . H/O asbestos exposure   . GERD (gastroesophageal reflux disease)   . Celiac artery aneurysm     Past Surgical History  Procedure Laterality Date  . Hernia repair  1994    right   . Cervical spine surgery  2012    C-Spine - Neck  . Spine surgery    . Eye surgery Left Feb. 2014    Muscle correction    Allergies  Allergen Reactions  . Epinephrine     REACTION: PVC's   No current facility-administered medications on file prior to encounter.   Current Outpatient Prescriptions on File Prior to Encounter  Medication Sig Dispense Refill  . aspirin 81 MG tablet Take 81 mg by mouth daily.    . Coenzyme Q10 (CO Q-10) 100 MG CAPS Take 100 mg by mouth daily.     Marland Kitchen ezetimibe (ZETIA)  10 MG tablet Take 10 mg by mouth daily.    . lansoprazole (PREVACID) 30 MG capsule Take 30 mg by mouth daily.     . Multiple Vitamin (MULTIVITAMIN) tablet Take 1 tablet by mouth daily.    . polyethylene glycol powder (GLYCOLAX/MIRALAX) powder Take 17 g by mouth daily as needed for mild constipation.   0  . rosuvastatin (CRESTOR) 5 MG tablet Take 5 mg by mouth every other day.    . esomeprazole (NEXIUM) 40 MG capsule Take 1 capsule (40 mg total) by mouth daily. 90 capsule 3   Social History   Social History  . Marital Status: Married    Spouse Name: N/A  . Number of Children: N/A  . Years of Education: N/A   Occupational History  . Not on file.   Social History Main Topics  . Smoking status: Never Smoker   . Smokeless tobacco: Never Used  . Alcohol Use: 1.2 oz/week    2 Glasses of wine per week  . Drug Use: No  . Sexual Activity: Not on file   Other Topics Concern  . Not on file   Social History Narrative  Family History  Problem Relation Age of Onset  . Cancer Mother     Multiple Myaloma  . Diabetes Father   . Heart attack Brother     Blood Leg- Leg  . Heart disease Brother   . Hypertension Brother      ROS:  As stated in the HPI and negative for all other systems.  Physical Exam: Blood pressure 105/64, pulse 61, temperature 98.1 F (36.7 C), temperature source Oral, resp. rate 19, SpO2 98 %.  GENERAL:  Well appearing HEENT:  Pupils equal round and reactive, fundi not visualized, oral mucosa unremarkable NECK:  No jugular venous distention, waveform within normal limits, carotid upstroke brisk and symmetric, no bruits, no thyromegaly LYMPHATICS:  No cervical, inguinal adenopathy LUNGS:  Clear to auscultation bilaterally BACK:  No CVA tenderness CHEST:  Unremarkable HEART:  PMI not displaced or sustained,S1 and S2 within normal limits, no S3, no S4, no clicks, no rubs, no murmurs ABD:  Flat, positive bowel sounds normal in frequency in pitch, no bruits, no  rebound, no guarding, no midline pulsatile mass, no hepatomegaly, no splenomegaly EXT:  2 plus pulses throughout, no edema, no cyanosis no clubbing SKIN:  No rashes no nodules NEURO:  Cranial nerves II through XII grossly intact, motor grossly intact throughout PSYCH:  Cognitively intact, oriented to person place and time   Labs: Lab Results  Component Value Date   BUN 10 07/17/2015   Lab Results  Component Value Date   CREATININE 1.15 07/17/2015   Lab Results  Component Value Date   NA 133* 07/17/2015   K 4.3 07/17/2015   CL 98* 07/17/2015   CO2 24 07/17/2015   Lab Results  Component Value Date   TROPONINI 7.17* 07/17/2015   Lab Results  Component Value Date   WBC 9.1 07/17/2015   HGB 14.8 07/17/2015   HCT 42.2 07/17/2015   MCV 90.0 07/17/2015   PLT 183 07/17/2015   No results found for: CHOL, HDL, LDLCALC, LDLDIRECT, TRIG, CHOLHDL No results found for: ALT, AST, GGT, ALKPHOS, BILITOT    Radiology:  CXR:  No evidence of thoracic or abdominal aortic aneurysm or dissection.  No evidence of significant pulmonary embolus. Aneurysms demonstratedin the celiac axis and along the splenic artery are unchanged since prior study.   EKG:  NSR, diffuse ST elevation, 1.5 mm II, III, aVF.  07/17/2015  ASSESSMENT AND PLAN:    Chest pain:  EKG suggests possible pericarditis.  However, could not exclude obstructive CAD.  Plan cardiac cath.  The patient understands that risks included but are not limited to stroke (1 in 1000), death (1 in 48), kidney failure [usually temporary] (1 in 500), bleeding (1 in 200), allergic reaction [possibly serious] (1 in 200).  The patient understands and agrees to proceed.   SignedMinus Breeding 07/17/2015, 5:47 AM

## 2015-07-18 ENCOUNTER — Observation Stay (HOSPITAL_COMMUNITY): Payer: 59

## 2015-07-18 LAB — CBC
HCT: 40.3 % (ref 39.0–52.0)
Hemoglobin: 13.8 g/dL (ref 13.0–17.0)
MCH: 31.4 pg (ref 26.0–34.0)
MCHC: 34.2 g/dL (ref 30.0–36.0)
MCV: 91.8 fL (ref 78.0–100.0)
Platelets: 144 10*3/uL — ABNORMAL LOW (ref 150–400)
RBC: 4.39 MIL/uL (ref 4.22–5.81)
RDW: 13.2 % (ref 11.5–15.5)
WBC: 7.3 10*3/uL (ref 4.0–10.5)

## 2015-07-18 LAB — BASIC METABOLIC PANEL
Anion gap: 8 (ref 5–15)
BUN: 7 mg/dL (ref 6–20)
CALCIUM: 8.4 mg/dL — AB (ref 8.9–10.3)
CO2: 23 mmol/L (ref 22–32)
CREATININE: 0.95 mg/dL (ref 0.61–1.24)
Chloride: 102 mmol/L (ref 101–111)
GFR calc non Af Amer: >60 mL/min (ref >60)
Glucose, Bld: 128 mg/dL — ABNORMAL HIGH (ref 65–99)
Potassium: 4 mmol/L (ref 3.5–5.1)
SODIUM: 133 mmol/L — AB (ref 135–145)

## 2015-07-18 MED ORDER — ACETAMINOPHEN 325 MG PO TABS
650.0000 mg | ORAL_TABLET | Freq: Four times a day (QID) | ORAL | Status: DC | PRN
  Administered 2015-07-18: 650 mg via ORAL
  Filled 2015-07-18: qty 2

## 2015-07-18 MED ORDER — MAGNESIUM HYDROXIDE 400 MG/5ML PO SUSP
30.0000 mL | Freq: Every day | ORAL | Status: DC | PRN
  Filled 2015-07-18: qty 30

## 2015-07-18 NOTE — Progress Notes (Signed)
  Echocardiogram 2D Echocardiogram has been performed.  Leonard Noble 07/18/2015, 11:33 AM

## 2015-07-18 NOTE — Progress Notes (Signed)
Echo complete and uploaded into EPIC.   Called MD, because patients temp rose to 100.3. MD verbal order for PRN tylenol, as well as CBC and BMET to be drawn now. Tylenol given, awaiting lab results.   Patient taking oral abx. Pt states he has taken them for two whole weeks prior to admission. Pt does not like the way these abx "make him feel". Pt states he "gets sweaty." When he takes them. MD notified. Will continue to monitor.

## 2015-07-18 NOTE — Progress Notes (Signed)
Ref: Antony Blackbird, MD   Subjective:  Wants to go home. Had episode of fever. Echocardiogram negative for pericardial effusion. EKG showed scooping ST elevation suggestive of acute pericarditis. Normal WBC count.  Objective:  Vital Signs in the last 24 hours: Temp:  [98.2 F (36.8 C)-100.3 F (37.9 C)] 100.3 F (37.9 C) (09/25 1401) Pulse Rate:  [77-94] 94 (09/25 1401) Cardiac Rhythm:  [-] Normal sinus rhythm (09/25 1512) Resp:  [16-17] 16 (09/25 1401) BP: (89-92)/(56-65) 91/61 mmHg (09/25 1401) SpO2:  [97 %-98 %] 97 % (09/25 1401) Weight:  [82.419 kg (181 lb 11.2 oz)] 82.419 kg (181 lb 11.2 oz) (09/25 0549)  Physical Exam: BP Readings from Last 1 Encounters:  07/18/15 91/61     Wt Readings from Last 1 Encounters:  07/18/15 82.419 kg (181 lb 11.2 oz)    Weight change: 1.089 kg (2 lb 6.4 oz)  HEENT: [place]/AT, Eyes- PERL, EOMI, Conjunctiva-Pink, Sclera-Non-icteric Neck: No JVD, No bruit, Trachea midline. Lungs:  Clear, Bilateral. Cardiac:  Regular rhythm, normal S1 and S2, no S3. No pericardial rub in supine or sitting Abdomen:  Soft, non-tender. Extremities:  No edema present. No cyanosis. No clubbing. CNS: AxOx3, Cranial nerves grossly intact, moves all 4 extremities. Right handed. Skin: Warm and dry.   Intake/Output from previous day: 09/24 0701 - 09/25 0700 In: 1479.5 [P.O.:840; I.V.:577; IV Piggyback:62.5] Out: 6789 [Urine:1475]    Lab Results: BMET    Component Value Date/Time   NA 133* 07/18/2015 1512   NA 133* 07/17/2015 0443   NA 140 10/28/2009 1634   K 4.0 07/18/2015 1512   K 4.3 07/17/2015 0443   K 3.9 10/28/2009 1634   CL 102 07/18/2015 1512   CL 98* 07/17/2015 0443   CL 104 10/28/2009 1634   CO2 23 07/18/2015 1512   CO2 24 07/17/2015 0443   CO2 29 10/28/2009 1634   GLUCOSE 128* 07/18/2015 1512   GLUCOSE 143* 07/17/2015 0443   GLUCOSE 130* 10/28/2009 1634   BUN 7 07/18/2015 1512   BUN 10 07/17/2015 0443   BUN 14 10/28/2009 1634   CREATININE 0.95  07/18/2015 1512   CREATININE 1.00 07/17/2015 0815   CREATININE 1.15 07/17/2015 0443   CALCIUM 8.4* 07/18/2015 1512   CALCIUM 9.1 07/17/2015 0443   CALCIUM 8.8 10/28/2009 1634   GFRNONAA >60 07/18/2015 1512   GFRNONAA >60 07/17/2015 0815   GFRNONAA >60 07/17/2015 0443   GFRAA >60 07/18/2015 1512   GFRAA >60 07/17/2015 0815   GFRAA >60 07/17/2015 0443   CBC    Component Value Date/Time   WBC 7.3 07/18/2015 1512   RBC 4.39 07/18/2015 1512   HGB 13.8 07/18/2015 1512   HCT 40.3 07/18/2015 1512   PLT 144* 07/18/2015 1512   MCV 91.8 07/18/2015 1512   MCH 31.4 07/18/2015 1512   MCHC 34.2 07/18/2015 1512   RDW 13.2 07/18/2015 1512   LYMPHSABS 0.9 10/28/2009 1634   MONOABS 0.8 10/28/2009 1634   EOSABS 0.0 10/28/2009 1634   BASOSABS 0.0 10/28/2009 1634   HEPATIC Function Panel No results for input(s): PROT in the last 8760 hours.  Invalid input(s):  ALBUMIN,  AST,  ALT,  ALKPHOS,  BILIDIR,  IBILI HEMOGLOBIN A1C No components found for: HGA1C,  MPG CARDIAC ENZYMES Lab Results  Component Value Date   TROPONINI 5.04* 07/17/2015   TROPONINI 6.68* 07/17/2015   TROPONINI 9.47* 07/17/2015   BNP No results for input(s): PROBNP in the last 8760 hours. TSH No results for input(s): TSH in the  last 8760 hours. CHOLESTEROL No results for input(s): CHOL in the last 8760 hours.  Scheduled Meds: . aspirin  81 mg Oral Daily  . colchicine  0.6 mg Oral BID  . ezetimibe  10 mg Oral Daily  . heparin  5,000 Units Subcutaneous 3 times per day  . ibuprofen  600 mg Oral 4 times per day  . multivitamin with minerals  1 tablet Oral Daily  . pantoprazole  20 mg Oral Daily  . rosuvastatin  5 mg Oral Q48H  . sodium chloride  3 mL Intravenous Q12H  . sucralfate  1 g Oral TID PC & HS  . sulfamethoxazole-trimethoprim  1 tablet Oral BID   Continuous Infusions:  PRN Meds:.sodium chloride, acetaminophen, magnesium hydroxide, morphine injection, ondansetron (ZOFRAN) IV, polyethylene glycol, sodium  chloride  Assessment/Plan: Acute pericarditis Fever IBS Pulmonary nodule GERD H/O mesenteric ischemia  H/O asbestos exposure Hyperglycemia  Continue medical treatment. Tylenol for fever. Hgb A1C     Dixie Dials  MD  07/18/2015, 5:36 PM

## 2015-07-19 ENCOUNTER — Encounter (HOSPITAL_COMMUNITY): Payer: Self-pay | Admitting: Interventional Cardiology

## 2015-07-19 LAB — HEMOGLOBIN A1C
HEMOGLOBIN A1C: 5.9 % — AB (ref 4.8–5.6)
MEAN PLASMA GLUCOSE: 123 mg/dL

## 2015-07-19 MED ORDER — IBUPROFEN 600 MG PO TABS
600.0000 mg | ORAL_TABLET | Freq: Three times a day (TID) | ORAL | Status: DC
Start: 2015-07-19 — End: 2017-04-20

## 2015-07-19 MED ORDER — COLCHICINE 0.6 MG PO TABS
0.6000 mg | ORAL_TABLET | Freq: Two times a day (BID) | ORAL | Status: DC
Start: 2015-07-19 — End: 2019-12-09

## 2015-07-19 NOTE — Discharge Instructions (Signed)
Cardiac Diet This diet can help prevent heart disease and stroke. Many factors influence your heart health, including eating and exercise habits. Coronary risk rises a lot with abnormal blood fat (lipid) levels. Cardiac meal planning includes limiting unhealthy fats, increasing healthy fats, and making other small dietary changes. General guidelines are as follows:  Adjust calorie intake to reach and maintain desirable body weight.  Limit total fat intake to less than 30% of total calories. Saturated fat should be less than 7% of calories.  Saturated fats are found in animal products and in some vegetable products. Saturated vegetable fats are found in coconut oil, cocoa butter, palm oil, and palm kernel oil. Read labels carefully to avoid these products as much as possible. Use butter in moderation. Choose tub margarines and oils that have 2 grams of fat or less. Good cooking oils are canola and olive oils.  Practice low-fat cooking techniques. Do not fry food. Instead, broil, bake, boil, steam, grill, roast on a rack, stir-fry, or microwave it. Other fat reducing suggestions include:  Remove the skin from poultry.  Remove all visible fat from meats.  Skim the fat off stews, soups, and gravies before serving them.  Steam vegetables in water or broth instead of sauting them in fat.  Avoid foods with trans fat (or hydrogenated oils), such as commercially fried foods and commercially baked goods. Commercial shortening and deep-frying fats will contain trans fat.  Increase intake of fruits, vegetables, whole grains, and legumes to replace foods high in fat.  Increase consumption of nuts, legumes, and seeds to at least 4 servings weekly. One serving of a legume equals  cup, and 1 serving of nuts or seeds equals  cup.  Choose whole grains more often. Have 3 servings per day (a serving is 1 ounce [oz]).  Eat 4 to 5 servings of vegetables per day. A serving of vegetables is 1 cup of raw leafy  vegetables;  cup of raw or cooked cut-up vegetables;  cup of vegetable juice.  Eat 4 to 5 servings of fruit per day. A serving of fruit is 1 medium whole fruit;  cup of dried fruit;  cup of fresh, frozen, or canned fruit;  cup of 100% fruit juice.  Increase your intake of dietary fiber to 20 to 30 grams per day. Insoluble fiber may help lower your risk of heart disease and may help curb your appetite.  Soluble fiber binds cholesterol to be removed from the blood. Foods high in soluble fiber are dried beans, citrus fruits, oats, apples, bananas, broccoli, Brussels sprouts, and eggplant.  Try to include foods fortified with plant sterols or stanols, such as yogurt, breads, juices, or margarines. Choose several fortified foods to achieve a daily intake of 2 to 3 grams of plant sterols or stanols.  Foods with omega-3 fats can help reduce your risk of heart disease. Aim to have a 3.5 oz portion of fatty fish twice per week, such as salmon, mackerel, albacore tuna, sardines, lake trout, or herring. If you wish to take a fish oil supplement, choose one that contains 1 gram of both DHA and EPA.  Limit processed meats to 2 servings (3 oz portion) weekly.  Limit the sodium in your diet to 1500 milligrams (mg) per day. If you have high blood pressure, talk to a registered dietitian about a DASH (Dietary Approaches to Stop Hypertension) eating plan.  Limit sweets and beverages with added sugar, such as soda, to no more than 5 servings per week. One  serving is:   1 tablespoon sugar.  1 tablespoon jelly or jam.   cup sorbet.  1 cup lemonade.   cup regular soda. CHOOSING FOODS Starches  Allowed: Breads: All kinds (wheat, rye, raisin, white, oatmeal, [place], [place], and English muffin bread). Low-fat rolls: English muffins, frankfurter and hamburger buns, bagels, pita bread, tortillas (not fried). Pancakes, waffles, biscuits, and muffins made with recommended oil.  Avoid: Products made with  saturated or trans fats, oils, or whole milk products. Butter rolls, cheese breads, croissants. Commercial doughnuts, muffins, sweet rolls, biscuits, waffles, pancakes, store-bought mixes. Crackers  Allowed: Low-fat crackers and snacks: Animal, graham, rye, saltine (with recommended oil, no lard), oyster, and matzo crackers. Bread sticks, melba toast, rusks, flatbread, pretzels, and light popcorn.  Avoid: High-fat crackers: cheese crackers, butter crackers, and those made with coconut, palm oil, or trans fat (hydrogenated oils). Buttered popcorn. Cereals  Allowed: Hot or cold whole-grain cereals.  Avoid: Cereals containing coconut, hydrogenated vegetable fat, or animal fat. Potatoes / Pasta / Rice  Allowed: All kinds of potatoes, rice, and pasta (such as macaroni, spaghetti, and noodles).  Avoid: Pasta or rice prepared with cream sauce or high-fat cheese. Chow mein noodles, [place] fries. Vegetables  Allowed: All vegetables and vegetable juices.  Avoid: Fried vegetables. Vegetables in cream, butter, or high-fat cheese sauces. Limit coconut. Fruit in cream or custard. Protein  Allowed: Limit your intake of meat, seafood, and poultry to no more than 6 oz (cooked weight) per day. All lean, well-trimmed beef, veal, pork, and lamb. All chicken and [place] without skin. All fish and shellfish. Wild game: wild duck, rabbit, pheasant, and venison. Egg whites or low-cholesterol egg substitutes may be used as desired. Meatless dishes: recipes with dried beans, peas, lentils, and tofu (soybean curd). Seeds and nuts: all seeds and most nuts.  Avoid: Prime grade and other heavily marbled and fatty meats, such as short ribs, spare ribs, rib eye roast or steak, frankfurters, sausage, bacon, and high-fat luncheon meats, mutton. Caviar. Commercially fried fish. Domestic duck, goose, venison sausage. Organ meats: liver, gizzard, heart, chitterlings, brains, kidney, sweetbreads. Dairy  Allowed: Low-fat  cheeses: nonfat or low-fat cottage cheese (1% or 2% fat), cheeses made with part skim milk, such as mozzarella, farmers, string, or ricotta. (Cheeses should be labeled no more than 2 to 6 grams fat per oz.). Skim (or 1%) milk: liquid, powdered, or evaporated. Buttermilk made with low-fat milk. Drinks made with skim or low-fat milk or cocoa. Chocolate milk or cocoa made with skim or low-fat (1%) milk. Nonfat or low-fat yogurt.  Avoid: Whole milk cheeses, including colby, cheddar, [place], Monterey Jack, [place], [place], [place], American, Swiss, and blue. Creamed cottage cheese, cream cheese. Whole milk and whole milk products, including buttermilk or yogurt made from whole milk, drinks made from whole milk. Condensed milk, evaporated whole milk, and 2% milk. Soups and Combination Foods  Allowed: Low-fat low-sodium soups: broth, dehydrated soups, homemade broth, soups with the fat removed, homemade cream soups made with skim or low-fat milk. Low-fat spaghetti, lasagna, chili, and Spanish rice if low-fat ingredients and low-fat cooking techniques are used.  Avoid: Cream soups made with whole milk, cream, or high-fat cheese. All other soups. Desserts and Sweets  Allowed: Sherbet, fruit ices, gelatins, meringues, and angel food cake. Homemade desserts with recommended fats, oils, and milk products. Jam, jelly, honey, marmalade, sugars, and syrups. Pure sugar candy, such as gum drops, hard candy, jelly beans, marshmallows, mints, and small amounts of dark chocolate.  Avoid: Commercially prepared  cakes, pies, cookies, frosting, pudding, or mixes for these products. Desserts containing whole milk products, chocolate, coconut, lard, palm oil, or palm kernel oil. Ice cream or ice cream drinks. Candy that contains chocolate, coconut, butter, hydrogenated fat, or unknown ingredients. Buttered syrups. Fats and Oils  Allowed: Vegetable oils: safflower, sunflower, corn, soybean, cottonseed, sesame, canola, olive,  or peanut. Non-hydrogenated margarines. Salad dressing or mayonnaise: homemade or commercial, made with a recommended oil. Low or nonfat salad dressing or mayonnaise.  Limit added fats and oils to 6 to 8 tsp per day (includes fats used in cooking, baking, salads, and spreads on bread). Remember to count the "hidden fats" in foods.  Avoid: Solid fats and shortenings: butter, lard, salt pork, bacon drippings. Gravy containing meat fat, shortening, or suet. Cocoa butter, coconut. Coconut oil, palm oil, palm kernel oil, or hydrogenated oils: these ingredients are often used in bakery products, nondairy creamers, whipped toppings, candy, and commercially fried foods. Read labels carefully. Salad dressings made of unknown oils, sour cream, or cheese, such as blue cheese and Roquefort. Cream, all kinds: half-and-half, light, heavy, or whipping. Sour cream or cream cheese (even if "light" or low-fat). Nondairy cream substitutes: coffee creamers and sour cream substitutes made with palm, palm kernel, hydrogenated oils, or coconut oil. Beverages  Allowed: Coffee (regular or decaffeinated), tea. Diet carbonated beverages, mineral water. Alcohol: Check with your caregiver. Moderation is recommended.  Avoid: Whole milk, regular sodas, and juice drinks with added sugar. Condiments  Allowed: All seasonings and condiments. Cocoa powder. "Cream" sauces made with recommended ingredients.  Avoid: Carob powder made with hydrogenated fats. SAMPLE MENU Breakfast   cup orange juice   cup oatmeal  1 slice toast  1 tsp margarine  1 cup skim milk Lunch  [place] sandwich with 2 oz [place], 2 slices bread  Lettuce and tomato slices  Fresh fruit  Carrot sticks  Coffee or tea Snack  Fresh fruit or low-fat crackers Dinner  3 oz lean ground beef  1 baked potato  1 tsp margarine   cup asparagus  Lettuce salad  1 tbs non-creamy dressing   cup peach slices  1 cup skim milk Document Released:  07/18/2008 Document Revised: 04/09/2012 Document Reviewed: 12/09/2013 [employer] Patient Information 2015 [place], [place]. This information is not intended to replace advice given to you by your health care provider. Make sure you discuss any questions you have with your health care provider. Pericarditis Pericarditis is swelling (inflammation) of the pericardium. The pericardium is a thin, double-layered, fluid-filled tissue sac that surrounds the heart. The purpose of the pericardium is to contain the heart in the chest cavity and keep the heart from overexpanding. Different types of pericarditis can occur, such as:  Acute pericarditis. Inflammation can develop suddenly in acute pericarditis.  Chronic pericarditis. Inflammation develops gradually and is long-lasting in chronic pericarditis.  Constrictive pericarditis. In this type of pericarditis, the layers of the pericardium stiffen and develop scar tissue. The scar tissue thickens and sticks together. This makes it difficult for the heart to pump and work as it normally does. CAUSES  Pericarditis can be caused from different conditions, such as:  A bacterial, fungal or viral infection.  After a heart attack (myocardial infarction).  After open-heart surgery (coronary bypass graft surgery).  Auto-immune conditions such as lupus, rheumatoid arthritis or scleroderma.  Kidney failure.  Low thyroid condition (hypothyroidism).  Cancer from another part of the body that has spread (metastasized) to the pericardium.  Chest injury or trauma.  After radiation  treatment.  Certain medicines. SYMPTOMS  Symptoms of pericarditis can include:  Chest pain. Chest pain symptoms may increase when laying down and may be relieved when sitting up and leaning forward.  A chronic, dry cough.  Heart palpitations. These may feel like rapid, fluttering or pounding heart beats.  Chest pain may be worse when swallowing.  Dizziness or  fainting.  Tiredness, fatigue or lethargy.  Fever. DIAGNOSIS  Pericarditis is diagnosed by the following:  A physical exam. A heart sound called a pericardial friction rub may be heard when your caregiver listens to your heart.  Blood work. Blood may be drawn to check for an infection and to look at your blood chemistry.  Electrocardiography. During electrocardiography your heart's electrical activity is monitored and recorded with a tracing on paper (electrocardiogram [ECG]).  Echocardiography.  Computed tomography (CT).  Magnetic resonance image (MRI). TREATMENT  To treat pericarditis, it is important to know the cause of it. The cause of pericarditis determines the treatment.   If the cause of pericarditis is due to an infection, treatment is based on the type of infection. If an infection is suspected in the pericardial fluid, a procedure called a pericardial fluid culture and biopsy may be done. This takes a sample of the pericardial fluid. The sample is sent to a lab which runs tests on the pericardial fluid to check for an infection.  If the autoimmune disease is the cause, treatment of the autoimmune condition will help improve the pericarditis.  If the cause of pericarditis is not known, anti-inflammatory medicines may be used to help decrease the inflammation.  Surgery may be needed. The following are types of surgeries or procedures that may be done to treat pericarditis:  Pericardial window. A pericardial window makes a cut (incision) into the pericardial sac. This allows excess fluid in the pericardium to drain.  Pericardiocentesis. A pericardiocentesis is also known as a pericardial tap. This procedure uses a needle that is guided by X-ray to drain (aspirate) excess fluid from the pericardium.  Pericardiectomy. A pericardiectomy removes part or all of the pericardium. HOME CARE INSTRUCTIONS   Do not smoke. If you smoke, quit. Your caregiver can help you quit  smoking.  Maintain a healthy weight.  Follow an exercise program as told by your caregiver.  If you drink alcohol, do so in moderation.  Eat a heart healthy diet. A registered dietician can help you learn about healthy food choices.  Keep a list of all your medicines with you at all times. Include the name, dose, how often it is taken and how it is taken. SEEK IMMEDIATE MEDICAL CARE IF:   You have chest pain or feelings of chest pressure.  You have sweating (diaphoresis) when at rest.  You have irregular heartbeats (palpitations).  You have rapid, racing heart beats.  You have unexplained fainting episodes.  You feel sick to your stomach (nausea) or vomiting without cause.  You have unexplained weakness. If you develop any of the symptoms which originally made you seek care, call for local emergency medical help. Do not drive yourself to the hospital. Document Released: 04/04/2001 Document Revised: 01/01/2012 Document Reviewed: 10/11/2011 [facility] Patient Information 2015 [place], [place]. This information is not intended to replace advice given to you by your health care provider. Make sure you discuss any questions you have with your health care provider.

## 2015-07-19 NOTE — Discharge Summary (Signed)
Physician Discharge Summary  Patient ID: Leonard Noble MRN: [id number] DOB/AGE: February 19, 1951 64 y.o.  Admit date: 07/17/2015 Discharge date: 07/19/2015  Admission Diagnoses: Acute pericarditis Fever IBS Pulmonary nodule GERD H/O mesenteric ischemia  H/O asbestos exposure Hyperglycemia  Discharge Diagnoses:  Principle Problem: * Acute pericarditis * Fever, secondary to influenza vaccine use IBS Pulmonary nodule GERD H/O mesenteric ischemia  H/O asbestos exposure Hyperglycemia-Prediabetic Left axillary sebaceous cystadenitis   Discharged Condition: fair  Hospital Course: 64 year old male presented with chest pain, gas like sensation that woke him up. EKG showed diffuse scooping ST elevation in inferior and lateral leads. His Troponin was 7.17 and CT chest was negative for dissection. He underwent emergent cardiac cath that was unchanged from 2011. He had sick feeling from Septra DS and influenza vaccine. He felt better off Septra DS and with use of colchicine and NSAID. His echocardiogram was negative for pericardial effusion. He was discharged home in stable condition with follow up by his doctor in 1 month and by Dr. Terrence Dupont in 2 weeks. Patient to keep periodic check up with his primary care for his prediabetic condition.  Consults: cardiology  Significant Diagnostic Studies: labs: Troponin-I peaked at 9.47. CBC was near normal. BMET was near normal except mild hyperglycemia and mild hyponatremia secondary to hyperglycemia. Hgb A1C was 5.9.  Treatments: analgesia: aspirin, colchicine, ibuprofen and cardiac meds: rosuvastatin and ezetimibe.  Discharge Exam: Blood pressure 93/61, pulse 79, temperature 98.2 F (36.8 C), temperature source Oral, resp. rate 18, height 6' (1.829 m), weight 80.151 kg (176 lb 11.2 oz), SpO2 99 %. HEENT: [place]/AT, Eyes- PERL, EOMI, Conjunctiva-Pink, Sclera-Non-icteric Neck: No JVD, No bruit, Trachea midline. Lungs: Clear, Bilateral. Cardiac: Regular rhythm,  normal S1 and S2, no S3. No pericardial rub in supine or sitting position. Abdomen: Soft, non-tender. Extremities: No edema present. No cyanosis. No clubbing. Left axillary healing cystic nodule. CNS: AxOx3, Cranial nerves grossly intact, moves all 4 extremities.  Skin: Warm and dry.  Disposition: 01, Home, self care     Medication List    STOP taking these medications        esomeprazole 40 MG capsule  Commonly known as:  NEXIUM     sulfamethoxazole-trimethoprim 800-160 MG per tablet  Commonly known as:  BACTRIM DS,SEPTRA DS      TAKE these medications        aspirin 81 MG tablet  Take 81 mg by mouth daily.     Co Q-10 100 MG Caps  Take 100 mg by mouth daily.     colchicine 0.6 MG tablet  Take 1 tablet (0.6 mg total) by mouth 2 (two) times daily.     ezetimibe 10 MG tablet  Commonly known as:  ZETIA  Take 10 mg by mouth daily.     Garlic 1062 MG Caps  Take 1,000 mg by mouth daily.     ibuprofen 600 MG tablet  Commonly known as:  ADVIL,MOTRIN  Take 1 tablet (600 mg total) by mouth 3 (three) times daily.     lansoprazole 30 MG capsule  Commonly known as:  PREVACID  Take 30 mg by mouth daily.     multivitamin tablet  Take 1 tablet by mouth daily.     polyethylene glycol powder powder  Commonly known as:  GLYCOLAX/MIRALAX  Take 17 g by mouth daily as needed for mild constipation.     rosuvastatin 5 MG tablet  Commonly known as:  CRESTOR  Take 5 mg by mouth every other day.  sucralfate 1 G tablet  Commonly known as:  CARAFATE  Take 1 tablet by mouth 4 (four) times daily - after meals and at bedtime.           Follow-up Information    Follow up with FULP, CAMMIE, MD. Schedule an appointment as soon as possible for a visit in 1 month.   Specialty:  Family Medicine   Contact information:   [address] [address] [phone]       Follow up with Charolette Forward, MD. Schedule an appointment as soon as possible for a visit in 2  weeks.   Specialty:  Cardiology   Contact information:   [place] [address] [place] [address] [phone]       Signed: Birdie Riddle 07/19/2015, 5:21 PM

## 2015-07-19 NOTE — Progress Notes (Signed)
Patient discharge paperwork gone over in detail with patient and wife. Patient education completed. All questions answered to patients satisfaction. IV and Telemetry discontinued. Patient discharged to home with wife by wheelchair. Pt stable at discharge.

## 2016-03-17 ENCOUNTER — Ambulatory Visit: Payer: 59 | Admitting: Vascular Surgery

## 2016-03-17 ENCOUNTER — Ambulatory Visit (HOSPITAL_COMMUNITY)
Admission: RE | Admit: 2016-03-17 | Discharge: 2016-03-17 | Disposition: A | Discharge status: Home / Self Care | Payer: 59 | Source: Ambulatory Visit | Attending: Vascular Surgery | Admitting: Vascular Surgery

## 2016-03-17 DIAGNOSIS — K219 Gastro-esophageal reflux disease without esophagitis: Secondary | ICD-10-CM | POA: Diagnosis not present

## 2016-03-17 DIAGNOSIS — I728 Aneurysm of other specified arteries: Secondary | ICD-10-CM | POA: Diagnosis not present

## 2016-03-24 ENCOUNTER — Encounter: Payer: Self-pay | Admitting: Vascular Surgery

## 2016-03-30 ENCOUNTER — Encounter: Payer: Self-pay | Admitting: Vascular Surgery

## 2016-03-30 NOTE — Progress Notes (Signed)
    Established Mesenteric Aneurysm   History of Present Illness  Leonard Noble is a 65 y.o. (02/20/1951) male who presents with chief complaint: follow up for celiac and splenic aneurysm. 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 The patient denies any back or abdominal pain. He has complete resolution of his prior abdominal pain. He denies any hematemesis, hematochezia, or melena. He continues to have constipationThe patient is not a smoker.  The patient's PMH, PSH, SH, FamHx, Med, and Allergies are unchanged from 03/12/15.  On ROS today: no abdominal pain, continued constipation   Physical Examination  Filed Vitals:   03/31/16 0908  BP: 132/78  Pulse: 55  Height: 6' (1.829 m)  Weight: 183 lb 9.6 oz (83.28 kg)  SpO2: 99%    General: A&O x 3, WD, thin  Pulmonary: Sym exp, good air movt, CTAB, no rales, rhonchi, & wheezing  Cardiac: RRR, Nl S1, S2, no Murmurs, rubs or gallops  Vascular: Vessel Right Left  Radial Palpable Palpable  Brachial Palpable Palpable  Carotid Palpable, without bruit Palpable, without bruit  Aorta Not palpable N/A  Femoral Palpable Palpable  Popliteal Not palpable Not palpable  PT Palpable Palpable  DP Palpable Palpable   Gastrointestinal: soft, NTND, -G/R, - HSM, - masses, - CVAT B  Musculoskeletal: M/S 5/5 throughout , Extremities without ischemic changes   Neurologic: Pain and light touch intact in extremities , Motor exam as listed above  Mesenteric Duplex (03/17/16)  Difficult exam due to gas  Celiac axis: 1.4 x 1.3 cm  Splenic aneurysms not visualized   Medical Decision Making  Leonard Noble is a 65 y.o. 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 There is controversy in the literature in regards management of celiac artery aneurysm due to the limited number of such in any center.   I discussed the risk of non-operative management including rupture and subsequent death, but we agreed that at this point, there is nothing forcing [place] to proceed with surgical intervention on his celiac aneurysm.   The two splenic aneurysms were not seen today but previously were too small to require any immediate intervention.  I would repeat the CTA in 12 months.  I emphasized the importance of maximal medical management including strict control of blood pressure, blood glucose, and lipid levels, antiplatelet agents, obtaining regular exercise, and cessation of smoking.   Thank you for allowing [place] to participate in this patient's care.   Adele Barthel, MD, FACS [facility] Office: [phone] Pager: [phone]

## 2016-03-31 ENCOUNTER — Encounter: Payer: Self-pay | Admitting: Vascular Surgery

## 2016-03-31 ENCOUNTER — Ambulatory Visit (INDEPENDENT_AMBULATORY_CARE_PROVIDER_SITE_OTHER): Payer: 59 | Admitting: Vascular Surgery

## 2016-03-31 ENCOUNTER — Ambulatory Visit: Payer: 59 | Admitting: Vascular Surgery

## 2016-03-31 VITALS — BP 132/78 | HR 55 | Ht 72.0 in | Wt 183.6 lb

## 2016-03-31 DIAGNOSIS — I728 Aneurysm of other specified arteries: Secondary | ICD-10-CM | POA: Diagnosis not present

## 2016-04-06 NOTE — Addendum Note (Signed)
Addended by: Mena Goes on: 04/06/2016 04:47 PM   Modules accepted: Orders

## 2016-04-10 DIAGNOSIS — E785 Hyperlipidemia, unspecified: Secondary | ICD-10-CM | POA: Diagnosis not present

## 2016-04-10 DIAGNOSIS — R7309 Other abnormal glucose: Secondary | ICD-10-CM | POA: Diagnosis not present

## 2016-04-10 DIAGNOSIS — I2510 Atherosclerotic heart disease of native coronary artery without angina pectoris: Secondary | ICD-10-CM | POA: Diagnosis not present

## 2016-04-10 DIAGNOSIS — J984 Other disorders of lung: Secondary | ICD-10-CM | POA: Diagnosis not present

## 2016-08-02 DIAGNOSIS — K219 Gastro-esophageal reflux disease without esophagitis: Secondary | ICD-10-CM | POA: Diagnosis not present

## 2016-08-02 DIAGNOSIS — F4310 Post-traumatic stress disorder, unspecified: Secondary | ICD-10-CM | POA: Diagnosis not present

## 2016-08-02 DIAGNOSIS — R739 Hyperglycemia, unspecified: Secondary | ICD-10-CM | POA: Diagnosis not present

## 2016-08-02 DIAGNOSIS — Z125 Encounter for screening for malignant neoplasm of prostate: Secondary | ICD-10-CM | POA: Diagnosis not present

## 2016-08-02 DIAGNOSIS — Z79899 Other long term (current) drug therapy: Secondary | ICD-10-CM | POA: Diagnosis not present

## 2016-08-02 DIAGNOSIS — Z23 Encounter for immunization: Secondary | ICD-10-CM | POA: Diagnosis not present

## 2016-08-02 DIAGNOSIS — E785 Hyperlipidemia, unspecified: Secondary | ICD-10-CM | POA: Diagnosis not present

## 2016-08-14 DIAGNOSIS — J984 Other disorders of lung: Secondary | ICD-10-CM | POA: Diagnosis not present

## 2016-08-14 DIAGNOSIS — I2510 Atherosclerotic heart disease of native coronary artery without angina pectoris: Secondary | ICD-10-CM | POA: Diagnosis not present

## 2016-08-14 DIAGNOSIS — E785 Hyperlipidemia, unspecified: Secondary | ICD-10-CM | POA: Diagnosis not present

## 2016-08-14 DIAGNOSIS — R7309 Other abnormal glucose: Secondary | ICD-10-CM | POA: Diagnosis not present

## 2016-08-21 DIAGNOSIS — H35363 Drusen (degenerative) of macula, bilateral: Secondary | ICD-10-CM | POA: Diagnosis not present

## 2016-09-12 DIAGNOSIS — F419 Anxiety disorder, unspecified: Secondary | ICD-10-CM | POA: Diagnosis not present

## 2016-12-04 DIAGNOSIS — N400 Enlarged prostate without lower urinary tract symptoms: Secondary | ICD-10-CM | POA: Diagnosis not present

## 2016-12-04 DIAGNOSIS — N5201 Erectile dysfunction due to arterial insufficiency: Secondary | ICD-10-CM | POA: Diagnosis not present

## 2017-01-12 DIAGNOSIS — R1031 Right lower quadrant pain: Secondary | ICD-10-CM | POA: Diagnosis not present

## 2017-01-12 DIAGNOSIS — K5900 Constipation, unspecified: Secondary | ICD-10-CM | POA: Diagnosis not present

## 2017-01-12 DIAGNOSIS — K589 Irritable bowel syndrome without diarrhea: Secondary | ICD-10-CM | POA: Diagnosis not present

## 2017-02-09 DIAGNOSIS — R1031 Right lower quadrant pain: Secondary | ICD-10-CM | POA: Diagnosis not present

## 2017-02-09 DIAGNOSIS — K589 Irritable bowel syndrome without diarrhea: Secondary | ICD-10-CM | POA: Diagnosis not present

## 2017-02-09 DIAGNOSIS — K5900 Constipation, unspecified: Secondary | ICD-10-CM | POA: Diagnosis not present

## 2017-02-12 DIAGNOSIS — R7309 Other abnormal glucose: Secondary | ICD-10-CM | POA: Diagnosis not present

## 2017-02-12 DIAGNOSIS — I2510 Atherosclerotic heart disease of native coronary artery without angina pectoris: Secondary | ICD-10-CM | POA: Diagnosis not present

## 2017-02-12 DIAGNOSIS — E785 Hyperlipidemia, unspecified: Secondary | ICD-10-CM | POA: Diagnosis not present

## 2017-02-12 DIAGNOSIS — J984 Other disorders of lung: Secondary | ICD-10-CM | POA: Diagnosis not present

## 2017-03-30 ENCOUNTER — Other Ambulatory Visit: Payer: Self-pay

## 2017-03-30 DIAGNOSIS — I728 Aneurysm of other specified arteries: Secondary | ICD-10-CM

## 2017-04-02 ENCOUNTER — Inpatient Hospital Stay: Admission: RE | Admit: 2017-04-02 | Payer: 59 | Source: Ambulatory Visit

## 2017-04-02 ENCOUNTER — Other Ambulatory Visit: Payer: 59

## 2017-04-04 DIAGNOSIS — H532 Diplopia: Secondary | ICD-10-CM | POA: Diagnosis not present

## 2017-04-04 DIAGNOSIS — H538 Other visual disturbances: Secondary | ICD-10-CM | POA: Diagnosis not present

## 2017-04-06 ENCOUNTER — Encounter: Payer: Self-pay | Admitting: Vascular Surgery

## 2017-04-07 ENCOUNTER — Encounter: Payer: Self-pay | Admitting: Vascular Surgery

## 2017-04-11 DIAGNOSIS — H532 Diplopia: Secondary | ICD-10-CM | POA: Diagnosis not present

## 2017-04-11 DIAGNOSIS — H538 Other visual disturbances: Secondary | ICD-10-CM | POA: Diagnosis not present

## 2017-04-11 DIAGNOSIS — H4912 Fourth [trochlear] nerve palsy, left eye: Secondary | ICD-10-CM | POA: Diagnosis not present

## 2017-04-13 ENCOUNTER — Ambulatory Visit: Payer: 59 | Admitting: Vascular Surgery

## 2017-04-18 NOTE — Progress Notes (Signed)
Established Mesenteric Aneurysm   History of Present Illness  Leonard Noble is a 66 y.o. ([birth year]) male who presents with chief complaint: follow up for celiac and splenic aneurysm. 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 The patient denies any back or abdominal pain. He has complete resolution of his prior abdominal pain. He denies any hematemesis, hematochezia, or melena. He continues to have constipation.  He is seeing GI for this.  The patient is not a smoker.  The patient's PMH, PSH, SH, FamHx, Med, and Allergies are unchanged from 03/30/16.  On ROS today: chronic abd pain, no flank pain   Physical Examination Vitals:   04/20/17 1014  BP: 109/64  Pulse: 68  Resp: 18  Temp: 98.5 F (36.9 C)  TempSrc: Oral  SpO2: 96%  Weight: 176 lb 3.2 oz (79.9 kg)  Height: 6' (1.829 m)    Body mass index is 23.9 kg/m.  General: A&O x 3, WD, thin  Pulmonary: Sym exp, good air movt, CTAB, no rales, rhonchi, & wheezing  Cardiac: RRR, Nl S1, S2, no Murmurs, rubs or gallops  Vascular: Vessel Right Left  Radial Palpable Palpable  Brachial Palpable Palpable  Carotid Palpable, without bruit Palpable, without bruit  Aorta Not palpable N/A  Femoral Palpable Palpable  Popliteal Not palpable Not palpable  PT Palpable Palpable  DP Palpable Palpable   Gastrointestinal: soft, NTND, -G/R, - HSM, - masses, - CVAT B  Musculoskeletal: M/S 5/5 throughout , Extremities without ischemic changes   Neurologic: Pain and light touch intact in extremities , Motor exam as listed above  Radiology   Ct Angio Abdomen W &/or Wo Contrast  Result Date: 04/20/2017 CLINICAL DATA:  Celiac annular EXAM: CT ANGIOGRAPHY ABDOMEN TECHNIQUE: Multidetector CT imaging of the abdomen was performed using the standard protocol during bolus administration of intravenous contrast.  Multiplanar reconstructed images and MIPs were obtained and reviewed to evaluate the vascular anatomy. CONTRAST:  75 cc Isovue 370 COMPARISON:  07/17/2015 FINDINGS: VASCULAR Aorta: Non aneurysmal and patent. Scattered atherosclerotic calcifications. Celiac: Celiac artery aneurysm is stable at 14 mm. Branch vessels patent. Narrowing at the origin is secondary to median arcuate ligament syndrome. Aneurysmal dilatation of the mid splenic artery is not significantly changed at 7 mm. Aneurysm of the splenic artery at the hilum of the spleen is stable at 10 mm. SMA: Patent. Renals: Single right renal artery is pain. Two left renal arteries are patent. IMA: Patent. Veins: Hepatic, portal, superior mesenteric, splenic, and renal veins are all patent. Review of the MIP images confirms the above findings. NON-VASCULAR Lower chest: Dependent atelectasis. 5 mm right lower lobe pulmonary nodule on image 15 is stable. Hepatobiliary: Gallbladder and liver are within normal limits. Pancreas: Unremarkable Spleen: Unremarkable Adrenals/Urinary Tract: Kidneys and adrenal glands are within normal limits. Stomach/Bowel: Stomach is unremarkable. Moderate stool burden throughout the colon. No obvious focal mass in the colon. No evidence of small-bowel obstruction. Lymphatic: No abnormal retroperitoneal adenopathy. Other: No free-fluid. Musculoskeletal: A new L4 superior endplate compression fracture has developed since the prior study. There is 20% loss of height anteriorly. There is slight retropulsion of the superior endplate. No obvious acute fracture lines are present. Age is indeterminate. 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 Stable 7 mm mid splenic artery aneurysm. Stable 10 mm splenic artery aneurysm in the hilum of the spleen. NON-VASCULAR New L4 compression fracture as described. Electronically Signed  By: Marybelle Killings M.D.   On: 04/20/2017 08:59   I reviewed the CTA, the splenic artery aneurysm are so  small that finding them is difficult.  The celiac artery aneurysm is stable and unchanged.   Medical Decision Making  Leonard Noble is a 66 y.o. (May 25, 1951) male who presents with: asymptomatic stable small celiac aneurysm and two asx small splenic artery aneurysms   Repeat mesenteric duplex in one year.  I emphasized the importance of maximal medical management including strict control of blood pressure, blood glucose, and lipid levels, antiplatelet agents, obtaining regular exercise, and cessation of smoking.   Thank you for allowing [place] to participate in this patient's care.   Adele Barthel, MD, FACS [facility] Office: [phone] Pager: [phone]

## 2017-04-20 ENCOUNTER — Ambulatory Visit
Admission: RE | Admit: 2017-04-20 | Discharge: 2017-04-20 | Disposition: A | Discharge status: Home / Self Care | Payer: Medicare Other | Source: Ambulatory Visit | Attending: Vascular Surgery | Admitting: Vascular Surgery

## 2017-04-20 ENCOUNTER — Ambulatory Visit (INDEPENDENT_AMBULATORY_CARE_PROVIDER_SITE_OTHER): Payer: 59 | Admitting: Vascular Surgery

## 2017-04-20 ENCOUNTER — Encounter: Payer: Self-pay | Admitting: Vascular Surgery

## 2017-04-20 VITALS — BP 109/64 | HR 68 | Temp 98.5°F | Resp 18 | Ht 72.0 in | Wt 176.2 lb

## 2017-04-20 DIAGNOSIS — I774 Celiac artery compression syndrome: Secondary | ICD-10-CM | POA: Diagnosis not present

## 2017-04-20 DIAGNOSIS — I728 Aneurysm of other specified arteries: Secondary | ICD-10-CM | POA: Diagnosis not present

## 2017-04-20 MED ORDER — IOPAMIDOL (ISOVUE-370) INJECTION 76%
75.0000 mL | Freq: Once | INTRAVENOUS | Status: AC | PRN
  Administered 2017-04-20: 75 mL via INTRAVENOUS

## 2017-05-04 NOTE — Addendum Note (Signed)
Addended by: Lianne Cure A on: 05/04/2017 04:14 PM   Modules accepted: Orders

## 2017-05-16 ENCOUNTER — Ambulatory Visit
Admission: RE | Admit: 2017-05-16 | Discharge: 2017-05-16 | Disposition: A | Discharge status: Home / Self Care | Payer: Medicare Other | Source: Ambulatory Visit | Attending: Ophthalmology | Admitting: Ophthalmology

## 2017-05-16 ENCOUNTER — Other Ambulatory Visit: Payer: Self-pay | Admitting: Ophthalmology

## 2017-05-16 DIAGNOSIS — Z01811 Encounter for preprocedural respiratory examination: Secondary | ICD-10-CM

## 2017-05-16 DIAGNOSIS — Z01818 Encounter for other preprocedural examination: Secondary | ICD-10-CM | POA: Diagnosis not present

## 2017-05-16 DIAGNOSIS — I728 Aneurysm of other specified arteries: Secondary | ICD-10-CM | POA: Diagnosis not present

## 2017-05-23 DIAGNOSIS — H5022 Vertical strabismus, left eye: Secondary | ICD-10-CM | POA: Diagnosis not present

## 2017-06-27 DIAGNOSIS — J069 Acute upper respiratory infection, unspecified: Secondary | ICD-10-CM | POA: Diagnosis not present

## 2017-07-11 DIAGNOSIS — M26621 Arthralgia of right temporomandibular joint: Secondary | ICD-10-CM | POA: Diagnosis not present

## 2017-07-11 DIAGNOSIS — H9201 Otalgia, right ear: Secondary | ICD-10-CM | POA: Diagnosis not present

## 2017-08-13 DIAGNOSIS — I2510 Atherosclerotic heart disease of native coronary artery without angina pectoris: Secondary | ICD-10-CM | POA: Diagnosis not present

## 2017-08-13 DIAGNOSIS — E785 Hyperlipidemia, unspecified: Secondary | ICD-10-CM | POA: Diagnosis not present

## 2017-08-13 DIAGNOSIS — R7309 Other abnormal glucose: Secondary | ICD-10-CM | POA: Diagnosis not present

## 2017-08-13 DIAGNOSIS — J984 Other disorders of lung: Secondary | ICD-10-CM | POA: Diagnosis not present

## 2017-12-26 ENCOUNTER — Emergency Department (HOSPITAL_COMMUNITY)
Admission: EM | Admit: 2017-12-26 | Discharge: 2017-12-27 | Disposition: A | Discharge status: Home / Self Care | Payer: 59 | Attending: Emergency Medicine | Admitting: Emergency Medicine

## 2017-12-26 ENCOUNTER — Emergency Department (HOSPITAL_COMMUNITY): Payer: 59

## 2017-12-26 ENCOUNTER — Encounter (HOSPITAL_COMMUNITY): Payer: Self-pay | Admitting: *Deleted

## 2017-12-26 ENCOUNTER — Other Ambulatory Visit: Payer: Self-pay

## 2017-12-26 DIAGNOSIS — Z7982 Long term (current) use of aspirin: Secondary | ICD-10-CM | POA: Diagnosis not present

## 2017-12-26 DIAGNOSIS — Z79899 Other long term (current) drug therapy: Secondary | ICD-10-CM | POA: Diagnosis not present

## 2017-12-26 DIAGNOSIS — R079 Chest pain, unspecified: Secondary | ICD-10-CM | POA: Diagnosis present

## 2017-12-26 DIAGNOSIS — R0789 Other chest pain: Secondary | ICD-10-CM | POA: Diagnosis not present

## 2017-12-26 HISTORY — DX: Disease of pericardium, unspecified: I31.9

## 2017-12-26 LAB — CBC
HEMATOCRIT: 42.2 % (ref 39.0–52.0)
Hemoglobin: 14.4 g/dL (ref 13.0–17.0)
MCH: 32.2 pg (ref 26.0–34.0)
MCHC: 34.1 g/dL (ref 30.0–36.0)
MCV: 94.4 fL (ref 78.0–100.0)
Platelets: 226 10*3/uL (ref 150–400)
RBC: 4.47 MIL/uL (ref 4.22–5.81)
RDW: 12.9 % (ref 11.5–15.5)
WBC: 5.8 10*3/uL (ref 4.0–10.5)

## 2017-12-26 LAB — TROPONIN I: Troponin I: <0.03 ng/mL (ref <0.03)

## 2017-12-26 LAB — BASIC METABOLIC PANEL WITH GFR
Anion gap: 10 (ref 5–15)
BUN: 16 mg/dL (ref 6–20)
CO2: 25 mmol/L (ref 22–32)
Calcium: 9 mg/dL (ref 8.9–10.3)
Chloride: 104 mmol/L (ref 101–111)
Creatinine, Ser: 1.03 mg/dL (ref 0.61–1.24)
GFR calc Af Amer: >60 mL/min
GFR calc non Af Amer: >60 mL/min
Glucose, Bld: 109 mg/dL — ABNORMAL HIGH (ref 65–99)
Potassium: 3.6 mmol/L (ref 3.5–5.1)
Sodium: 139 mmol/L (ref 135–145)

## 2017-12-26 NOTE — ED Triage Notes (Signed)
The pt is c/o lt mid chest pain for 2 weeks.  He had flu symptoms then and since then he has not gotten any relief.  Some sob mild dizziness no nausea

## 2017-12-27 NOTE — ED Provider Notes (Signed)
[facility] EMERGENCY DEPARTMENT Provider Note   CSN: [id number] Arrival date & time: 12/26/17  1655     History   Chief Complaint Chief Complaint  Patient presents with  . Chest Pain    HPI Leonard Noble is a 67 y.o. male.  Patient is a 67 year old male with past medical history of pericarditis,, celiac artery aneurysm.  He presents today for evaluation of chest pain.  He describes a constant, dull pain to the left lateral chest wall that has been present for 2 weeks.  This began in the absence of any injury or trauma.  He denies any difficulty breathing or pleuritic component.  He denies any fevers, chills, or cough.   The history is provided by the patient.  Chest Pain   This is a new problem. Episode onset: 2 weeks ago. The problem occurs constantly. The problem has not changed since onset.The pain is present in the substernal region. The pain is at a severity of 2/10. The pain is mild. The quality of the pain is described as dull. He has tried nothing for the symptoms. The treatment provided no relief. There are no known risk factors.    Past Medical History:  Diagnosis Date  . Celiac artery aneurysm ([place])   . GERD (gastroesophageal reflux disease)   . H/O asbestos exposure   . H/O prostatitis   . IBS (irritable bowel syndrome)   . Pericarditis   . Pulmonary nodule   . SOB (shortness of breath) on exertion     Patient Active Problem List   Diagnosis Date Noted  . Pericarditis 07/17/2015  . Elevated troponin   . Abnormal ECG   . Aneurysm of splenic artery ([place]) 05/24/2012  . Celiac artery aneurysm ([place]) 05/24/2012  . Nonspecific (abnormal) findings on radiological and other examination of body structure 05/09/2010  . CT, CHEST, ABNORMAL 05/09/2010  . COUGH 06/18/2009    Past Surgical History:  Procedure Laterality Date  . CARDIAC CATHETERIZATION N/A 07/17/2015   Procedure: Left Heart Cath and Coronary Angiography;  Surgeon: Jettie Booze, MD;   Location: [facility];  Service: Cardiovascular;  Laterality: N/A;  . CERVICAL SPINE SURGERY  2012   C-Spine - Neck  . EYE SURGERY Left Feb. 2014   Muscle correction  . HERNIA REPAIR  1994   right   . SPINE SURGERY         Home Medications    Prior to Admission medications   Medication Sig Start Date End Date Taking? Authorizing Provider  aspirin 81 MG tablet Take 81 mg by mouth daily.    [provider]  Coenzyme Q10 (CO Q-10) 100 MG CAPS Take 100 mg by mouth daily.     [provider]  colchicine 0.6 MG tablet Take 1 tablet (0.6 mg total) by mouth 2 (two) times daily. Patient not taking: Reported on 03/31/2016 07/19/15   Dixie Dials, MD  ezetimibe (ZETIA) 10 MG tablet Take 10 mg by mouth daily.    [provider]  lansoprazole (PREVACID) 30 MG capsule Take 30 mg by mouth every other day.  03/29/13   [provider]  Multiple Vitamin (MULTIVITAMIN) tablet Take 1 tablet by mouth daily.    [provider]  Multiple Vitamins-Minerals (PRESERVISION AREDS PO) Take by mouth.    [provider]  polyethylene glycol powder (GLYCOLAX/MIRALAX) powder Take 17 g by mouth daily as needed for mild constipation.  01/22/15   [provider]  rosuvastatin (CRESTOR)  5 MG tablet Take 5 mg by mouth every other day.    [provider]  sucralfate (CARAFATE) 1 G tablet Take 1 tablet by mouth 4 (four) times daily - after meals and at bedtime. Reported on 03/31/2016 07/14/15   [provider]    Family History Family History  Problem Relation Age of Onset  . Cancer Mother        Multiple Myaloma  . Diabetes Father   . Heart attack Brother        Blood Leg- Leg  . Heart disease Brother   . Hypertension Brother     Social History Social History   Tobacco Use  . Smoking status: Never Smoker  . Smokeless tobacco: Never Used  Substance Use Topics  . Alcohol use: Yes    Alcohol/week: 1.2 oz    Types: 2 Glasses of  wine per week  . Drug use: No     Allergies   Epinephrine   Review of Systems Review of Systems  Cardiovascular: Positive for chest pain.  All other systems reviewed and are negative.    Physical Exam Updated Vital Signs BP 132/82 (BP Location: Right Arm)   Pulse 69   Temp 98.3 F (36.8 C) (Oral)   Resp 18   Ht 6\' 1"  (1.854 m)   Wt 77.1 kg (170 lb)   SpO2 99%   BMI 22.43 kg/m   Physical Exam  Constitutional: He is oriented to person, place, and time. He appears well-developed and well-nourished. No distress.  HENT:  Head: Normocephalic and atraumatic.  Mouth/Throat: Oropharynx is clear and moist.  Neck: Normal range of motion. Neck supple.  Cardiovascular: Normal rate and regular rhythm. Exam reveals no friction rub.  No murmur heard. Pulmonary/Chest: Effort normal and breath sounds normal. No respiratory distress. He has no wheezes. He has no rales.  Abdominal: Soft. Bowel sounds are normal. He exhibits no distension. There is no tenderness.  Musculoskeletal: Normal range of motion. He exhibits no edema.       Right lower leg: Normal. He exhibits no edema.       Left lower leg: Normal. He exhibits no edema.  Neurological: He is alert and oriented to person, place, and time. Coordination normal.  Skin: Skin is warm and dry. He is not diaphoretic.  Nursing note and vitals reviewed.    ED Treatments / Results  Labs (all labs ordered are listed, but only abnormal results are displayed) Labs Reviewed  BASIC METABOLIC PANEL - Abnormal; Notable for the following components:      Result Value   Glucose, Bld 109 (*)    All other components within normal limits  CBC  TROPONIN I    EKG  EKG Interpretation  Date/Time:  Wednesday December 26 2017 16:59:38 EST Ventricular Rate:  77 PR Interval:  162 QRS Duration: 98 QT Interval:  392 QTC Calculation: 443 R Axis:   83 Text Interpretation:  Normal sinus rhythm Normal ECG Confirmed by Veryl Speak [id number] on  12/26/2017 11:49:16 PM       Radiology Dg Chest 2 View  Result Date: 12/26/2017 CLINICAL DATA:  Left-sided chest pain beneath the armpit x2 weeks. EXAM: CHEST - 2 VIEW COMPARISON:  05/16/2017 FINDINGS: Anterior cervical disc fusion hardware is noted from C6 through T1. Minimal aortic atherosclerosis along the transverse portion without aneurysm. Heart size is normal. Emphysematous hyperinflation of the lungs without acute pneumonic consolidation, effusion or pneumothorax. Mild degenerative change along dorsal spine. IMPRESSION: COPD without acute  pulmonary disease.  Aortic atherosclerosis. Electronically Signed   By: Ashley Royalty M.D.   On: 12/26/2017 19:13    Procedures Procedures (including critical care time)  Medications Ordered in ED Medications - No data to display   Initial Impression / Assessment and Plan / ED Course  I have reviewed the triage vital signs and the nursing notes.  Pertinent labs & imaging results that were available during my care of the patient were reviewed by me and considered in my medical decision making (see chart for details).  Patient is a 67 year old male with history of pericarditis.  He presents with complaints of dull pain to the left side of his chest that is been present for the past 2 weeks.  His symptoms are extremely atypical for cardiac pain.  He has an unchanged EKG and negative troponin after 2 weeks worth of symptoms.  His chest x-ray is clear and laboratory studies are unremarkable.  I highly doubt a cardiac etiology and nothing else appears emergent.  I suspect this is a pain in the chest wall.  I will recommend anti-inflammatories, rest, and follow-up.  Final Clinical Impressions(s) / ED Diagnoses   Final diagnoses:  None    ED Discharge Orders    None       Veryl Speak, MD 12/27/17 [phone]

## 2017-12-27 NOTE — Discharge Instructions (Signed)
Ibuprofen 600 mg 3 times daily for the next 4 days.  Follow-up with your primary doctor if not improving in the next week, and return to the ER if symptoms significantly worsen or change.

## 2018-01-01 DIAGNOSIS — E785 Hyperlipidemia, unspecified: Secondary | ICD-10-CM | POA: Diagnosis not present

## 2018-01-01 DIAGNOSIS — R079 Chest pain, unspecified: Secondary | ICD-10-CM | POA: Diagnosis not present

## 2018-01-01 DIAGNOSIS — R0789 Other chest pain: Secondary | ICD-10-CM | POA: Diagnosis not present

## 2018-02-11 DIAGNOSIS — E785 Hyperlipidemia, unspecified: Secondary | ICD-10-CM | POA: Diagnosis not present

## 2018-02-11 DIAGNOSIS — J984 Other disorders of lung: Secondary | ICD-10-CM | POA: Diagnosis not present

## 2018-02-11 DIAGNOSIS — I2510 Atherosclerotic heart disease of native coronary artery without angina pectoris: Secondary | ICD-10-CM | POA: Diagnosis not present

## 2018-02-11 DIAGNOSIS — R7309 Other abnormal glucose: Secondary | ICD-10-CM | POA: Diagnosis not present

## 2018-02-11 DIAGNOSIS — R0789 Other chest pain: Secondary | ICD-10-CM | POA: Diagnosis not present

## 2018-02-11 DIAGNOSIS — I728 Aneurysm of other specified arteries: Secondary | ICD-10-CM | POA: Diagnosis not present

## 2018-03-14 DIAGNOSIS — D126 Benign neoplasm of colon, unspecified: Secondary | ICD-10-CM | POA: Diagnosis not present

## 2018-05-01 DIAGNOSIS — M545 Low back pain: Secondary | ICD-10-CM | POA: Diagnosis not present

## 2018-05-03 ENCOUNTER — Ambulatory Visit (INDEPENDENT_AMBULATORY_CARE_PROVIDER_SITE_OTHER): Payer: 59 | Admitting: Family

## 2018-05-03 ENCOUNTER — Ambulatory Visit: Payer: 59 | Admitting: Vascular Surgery

## 2018-05-03 ENCOUNTER — Ambulatory Visit (HOSPITAL_COMMUNITY)
Admission: RE | Admit: 2018-05-03 | Discharge: 2018-05-03 | Disposition: A | Discharge status: Home / Self Care | Payer: 59 | Source: Ambulatory Visit | Attending: Vascular Surgery | Admitting: Vascular Surgery

## 2018-05-03 ENCOUNTER — Encounter (HOSPITAL_COMMUNITY): Payer: 59

## 2018-05-03 ENCOUNTER — Encounter: Payer: Self-pay | Admitting: Family

## 2018-05-03 VITALS — BP 129/82 | HR 70 | Temp 97.3°F | Resp 16 | Ht 72.0 in | Wt 172.0 lb

## 2018-05-03 DIAGNOSIS — I728 Aneurysm of other specified arteries: Secondary | ICD-10-CM | POA: Insufficient documentation

## 2018-05-03 DIAGNOSIS — R109 Unspecified abdominal pain: Secondary | ICD-10-CM | POA: Diagnosis not present

## 2018-05-03 DIAGNOSIS — I774 Celiac artery compression syndrome: Secondary | ICD-10-CM | POA: Diagnosis not present

## 2018-05-03 NOTE — Patient Instructions (Signed)
Before your next abdominal ultrasound:  Avoid gas forming foods and beverages the day before the test.   Take two Extra-Strength Gas-X capsules at bedtime the night before the test. Take another two Extra-Strength Gas-X capsules in the middle of the night if you get up to the restroom, if not, first thing in the morning with water.    

## 2018-05-03 NOTE — Progress Notes (Signed)
CC: Follow up mesenteric artery aneurysms    History of Present Illness  Leonard Noble is a 67 y.o. ([birth year]) male whom Dr. Bridgett Larsson has been monitoring for celiac and splenic aneurysm. 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 The patient denies any back or abdominal pain. He has complete resolution of his prior abdominal pain. He denies any hematemesis, hematochezia, or melena. He continues to have constipation.  He is seeing GI for this.  The patient is not a smoker.  Dr. Bridgett Larsson last evaluated pt on 04-20-17. At that time Dr. Bridgett Larsson reviewed the CTA, the splenic artery aneurysm are so small that finding them was difficult.  The celiac artery aneurysm was stable and unchanged. Asymptomatic stable small celiac aneurysm and two asymptomatic small splenic artery aneurysms Dr. Bridgett Larsson advised repeat mesenteric duplex in one year.  His brother died Mar 06, 2014. He had cysts excised in his axilla a few years ago.  He was walking 5-6 miles daily in his job until he injured his back in June 2019. Pt has a history of an anxiety attack. States Dr. Terrence Dupont did a cardiac cath in 2011 or 2012, no results on file for this.  He denies cardiac issues other than occasional PVC's.   His ophthalmologist told him he may have had an occular stroke in the left eye in the past, or the findings may be due to an old blow to the head.  He no longer takes pravastatin due to myalgias.  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  He has had constant RLQ abdominal pain since an inguinal hernia repair in the early 1990's. He has had this extensively evaluated.  He denies post prandial abdominal pain. His weight is 172 today, was 176 a year ago.   He developed low back spasms a week ago after  bathing his dog. He was evaluated and treated by ortho, [facility] He does not seem to have abdominal or back pain associated with the small mesenteric arty aneurysms.   Diabetic: borderline, last A1C result on file was 5.9 in 2016; pt states his most recent A1C was  109 random glucose with BMP on 12-26-17, seen in ED for atypical chest pain, no cardiac issue foung, he did have elevated troponin. His father had late onset DM.  Tobacco use: never   Past Medical History:  Diagnosis Date  . Celiac artery aneurysm ([place])   . GERD (gastroesophageal reflux disease)   . H/O asbestos exposure   . H/O prostatitis   . IBS (irritable bowel syndrome)   . Pericarditis   . Pulmonary nodule   . SOB (shortness of breath) on exertion     Social History Social History   Tobacco Use  . Smoking status: Never Smoker  . Smokeless tobacco: Never Used  Substance Use Topics  . Alcohol use: Yes    Alcohol/week: 1.2 oz    Types: 2 Glasses of wine per week  . Drug use: No    Family History Family History  Problem Relation Age of Onset  . Cancer Mother        Multiple Myaloma  . Diabetes Father   . Heart attack Brother        Blood Leg- Leg  . Heart disease Brother   .  Hypertension Brother     Surgical History Past Surgical History:  Procedure Laterality Date  . CARDIAC CATHETERIZATION N/A 07/17/2015   Procedure: Left Heart Cath and Coronary Angiography;  Surgeon: Jettie Booze, MD;  Location: [facility];  Service: Cardiovascular;  Laterality: N/A;  . CERVICAL SPINE SURGERY  2012   C-Spine - Neck  . EYE SURGERY Left Feb. 2014   Muscle correction  . HERNIA REPAIR  1994   right   . SPINE SURGERY      Allergies  Allergen Reactions  . Epinephrine     REACTION: PVC's  . Bactrim [Sulfamethoxazole-Trimethoprim]     Nauseous, shortness of breath    Current Outpatient Medications  Medication Sig Dispense Refill  . aspirin 81 MG tablet Take 81 mg by mouth daily.    .  Cholecalciferol (VITAMIN D-3) 5000 units TABS Take by mouth.    . Coenzyme Q10 (CO Q-10) 100 MG CAPS Take 100 mg by mouth daily.     . cyclobenzaprine (FLEXERIL) 5 MG tablet Take 5 mg by mouth 2 (two) times daily.    Marland Kitchen ezetimibe (ZETIA) 10 MG tablet Take 10 mg by mouth daily.    . lansoprazole (PREVACID) 30 MG capsule Take 30 mg by mouth every other day.     . methocarbamol (ROBAXIN) 500 MG tablet Take 500 mg by mouth 2 (two) times daily.    . Multiple Vitamin (MULTIVITAMIN) tablet Take 1 tablet by mouth daily.    . Multiple Vitamins-Minerals (PRESERVISION AREDS PO) Take by mouth.    . polyethylene glycol powder (GLYCOLAX/MIRALAX) powder Take 17 g by mouth daily as needed for mild constipation.   0  . rosuvastatin (CRESTOR) 5 MG tablet Take 5 mg by mouth every other day.    . sucralfate (CARAFATE) 1 G tablet Take 1 tablet by mouth 4 (four) times daily - after meals and at bedtime. Reported on 03/31/2016  0  . traMADol (ULTRAM) 50 MG tablet Take 50 mg by mouth every 6 (six) hours as needed.    . colchicine 0.6 MG tablet Take 1 tablet (0.6 mg total) by mouth 2 (two) times daily. (Patient not taking: Reported on 03/31/2016) 60 tablet 1   No current facility-administered medications for this visit.     ROS: see HPI for pertinent positives and negatives    Physical Examination  Vitals:   05/03/18 0941  BP: 129/82  Pulse: 70  Resp: 16  Temp: (!) 97.3 F (36.3 C)  TempSrc: Oral  SpO2: 100%  Weight: 172 lb (78 kg)  Height: 6' (1.829 m)   Body mass index is 23.33 kg/m.  General: A&O x 3, WDWN, male Gait: slow, steady HEENT: no gross abnormalities  Pulmonary: Respirations are non labored, good air movement in all fields, CTAB, no rales, rhonchi, or wheezing. Cardiac: RRR, Nl S1, S2, +murmur.  Vascular: Vessel Right Left  Radial 2+Palpable 2+Palpable  Brachial Palpable Palpable  Carotid  without bruit  without bruit  Aorta Not palpable N/A  Femoral 2+Palpable 2+Palpable  Popliteal  2+ palpable 1+ palpable  PT 2+Palpable 2+Palpable  DP 2+Palpable 2+Palpable   Gastrointestinal: soft, NTND, -G/R, - HSM, - palpable masses, - CVAT. Musculoskeletal: M/S 5/5 throughout, Extremities without ischemic changes. Is wearing removable lumbar back support.  Skin: No rash, no ulcers, no cellulitis.  Neurologic: Pain and light touch intact in extremities, Motor exam as listed above. CN 2-12 intact.  Psychiatric: Normal thought content, mood appropriate to clinical situation.  Non-Invasive Vascular Imaging  Mesenteric Duplex (Date: 05/03/2018):   Ao: 66-101 cm/s PSV  Celiac artery: 237 cm/s at origin  SMA: 166 cm/s  IMA: 220 cm/s  Splenic: 100 cm/s  CHA: 111 cm/s 70-99% stenosis in the celiac artery, may be due to caliber of artery. Areas of limited visceral study include mesenteric arteries.  The celiac axis measures 1.49 cm x 1.7 cm (compared to 1.4 cm x 1.3 cm on 03-17-16).  Unable to evaluate splenic artery due to small vessel size.  Slight increase in diameter of celiac artery compared to the exam on 03-17-16.  Medical Decision Making  Leonard Noble is a 67 y.o. male who presents with: asymptomatic stable small celiac aneurysm and two asx small splenic artery aneurysms.    Based on her exam and studies, I have offered the patient follow up in 1 year with mesenteric duplex.  I discussed in depth with the patient the nature of atherosclerosis, and emphasized the importance of maximal medical management including strict control of blood pressure, blood glucose, and lipid levels, obtaining regular exercise, and cessation of smoking.    The patient is aware that without maximal medical management the underlying atherosclerotic disease process will progress, limiting the benefit of any interventions.  Thank you for allowing [place] to participate in this patient's care.  Clemon Chambers, RN, MSN, FNP-C [facility] Office:  [phone]  Clinic MD: Donzetta Matters  05/03/2018, 9:51 AM

## 2018-06-12 DIAGNOSIS — E785 Hyperlipidemia, unspecified: Secondary | ICD-10-CM | POA: Diagnosis not present

## 2018-06-12 DIAGNOSIS — R7303 Prediabetes: Secondary | ICD-10-CM | POA: Diagnosis not present

## 2018-06-12 DIAGNOSIS — I10 Essential (primary) hypertension: Secondary | ICD-10-CM | POA: Diagnosis not present

## 2018-07-21 IMAGING — DX DG CHEST 2V
3 series · 3 of 3 positions shown · non-contrast
Comparison: 05/16/2017

CLINICAL DATA: Left-sided chest pain beneath the armpit x2 weeks.

EXAM:
CHEST - 2 VIEW

[w chest pa (1 of 2)]
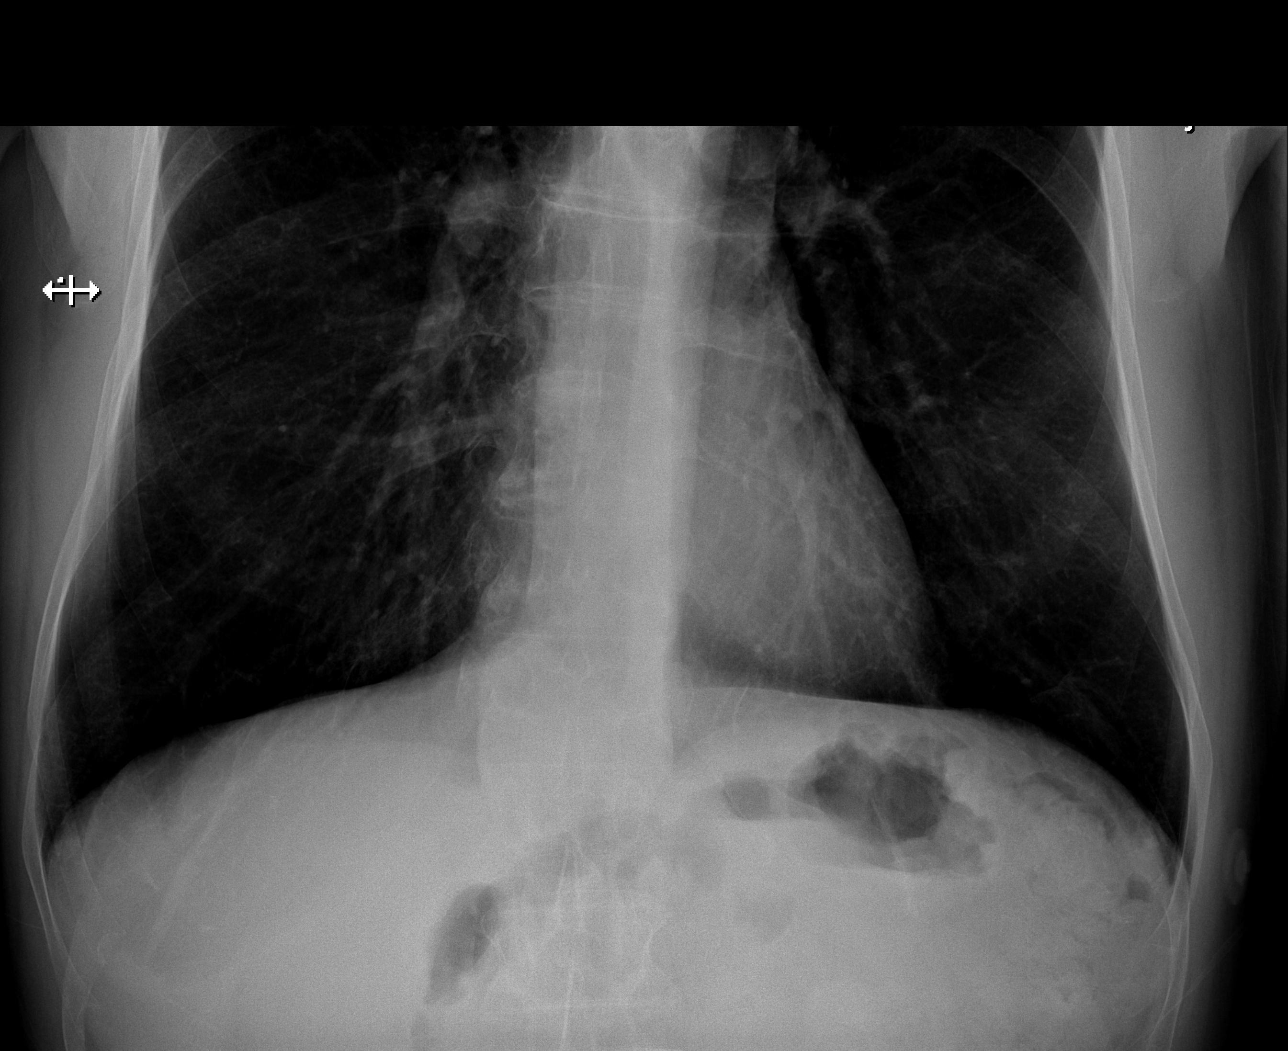

[w chest pa (2 of 2)]
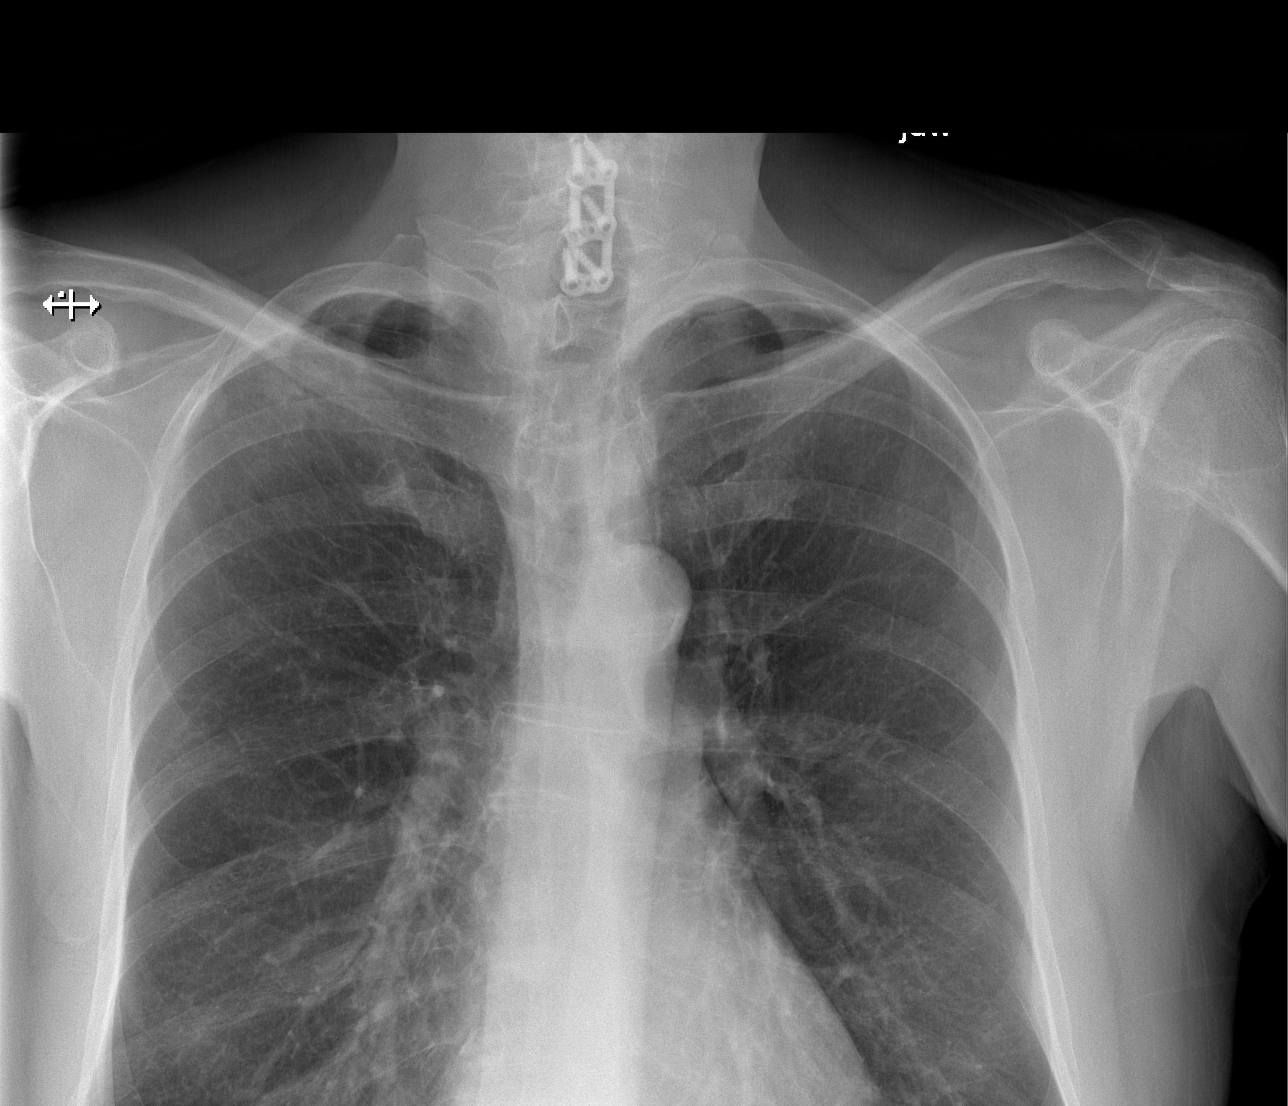

[w chest lat]
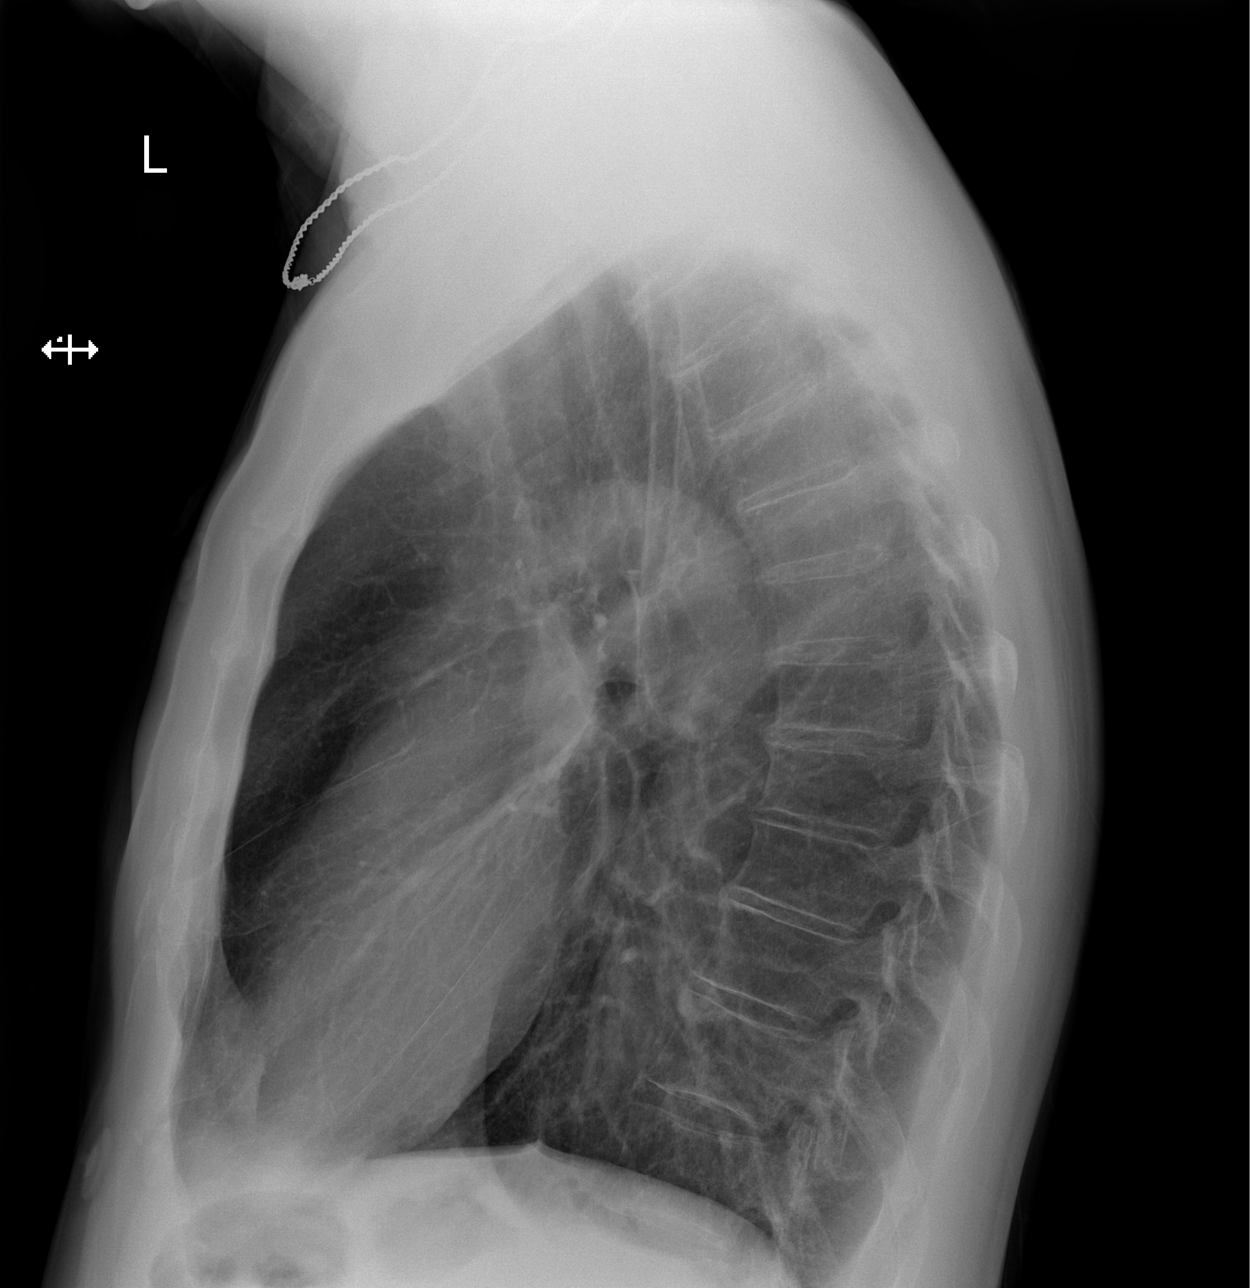

[3 of 3 positions shown; findings below may reference images not displayed]

FINDINGS: Anterior cervical disc fusion hardware is noted from C6 through T1.
Minimal aortic atherosclerosis along the transverse portion without
aneurysm. Heart size is normal. Emphysematous hyperinflation of the
lungs without acute pneumonic consolidation, effusion or
pneumothorax. Mild degenerative change along dorsal spine.
IMPRESSION: COPD without acute pulmonary disease.  Aortic atherosclerosis.

## 2018-07-25 DIAGNOSIS — Z23 Encounter for immunization: Secondary | ICD-10-CM | POA: Diagnosis not present

## 2018-08-26 DIAGNOSIS — I2510 Atherosclerotic heart disease of native coronary artery without angina pectoris: Secondary | ICD-10-CM | POA: Diagnosis not present

## 2018-08-26 DIAGNOSIS — J984 Other disorders of lung: Secondary | ICD-10-CM | POA: Diagnosis not present

## 2018-08-26 DIAGNOSIS — R0789 Other chest pain: Secondary | ICD-10-CM | POA: Diagnosis not present

## 2018-08-26 DIAGNOSIS — R7303 Prediabetes: Secondary | ICD-10-CM | POA: Diagnosis not present

## 2018-09-05 DIAGNOSIS — E785 Hyperlipidemia, unspecified: Secondary | ICD-10-CM | POA: Diagnosis not present

## 2018-09-05 DIAGNOSIS — Z125 Encounter for screening for malignant neoplasm of prostate: Secondary | ICD-10-CM | POA: Diagnosis not present

## 2018-09-05 DIAGNOSIS — Z23 Encounter for immunization: Secondary | ICD-10-CM | POA: Diagnosis not present

## 2018-09-05 DIAGNOSIS — Z0000 Encounter for general adult medical examination without abnormal findings: Secondary | ICD-10-CM | POA: Diagnosis not present

## 2018-09-05 DIAGNOSIS — M549 Dorsalgia, unspecified: Secondary | ICD-10-CM | POA: Diagnosis not present

## 2018-09-05 DIAGNOSIS — F4310 Post-traumatic stress disorder, unspecified: Secondary | ICD-10-CM | POA: Diagnosis not present

## 2018-09-05 DIAGNOSIS — J069 Acute upper respiratory infection, unspecified: Secondary | ICD-10-CM | POA: Diagnosis not present

## 2018-10-18 DIAGNOSIS — J018 Other acute sinusitis: Secondary | ICD-10-CM | POA: Diagnosis not present

## 2018-10-18 DIAGNOSIS — J069 Acute upper respiratory infection, unspecified: Secondary | ICD-10-CM | POA: Diagnosis not present

## 2018-10-21 DIAGNOSIS — J019 Acute sinusitis, unspecified: Secondary | ICD-10-CM | POA: Diagnosis not present

## 2018-10-21 DIAGNOSIS — R509 Fever, unspecified: Secondary | ICD-10-CM | POA: Diagnosis not present

## 2018-10-21 DIAGNOSIS — R0600 Dyspnea, unspecified: Secondary | ICD-10-CM | POA: Diagnosis not present

## 2019-02-14 DIAGNOSIS — N5201 Erectile dysfunction due to arterial insufficiency: Secondary | ICD-10-CM | POA: Diagnosis not present

## 2019-02-14 DIAGNOSIS — N400 Enlarged prostate without lower urinary tract symptoms: Secondary | ICD-10-CM | POA: Diagnosis not present

## 2019-02-21 DIAGNOSIS — W57XXXA Bitten or stung by nonvenomous insect and other nonvenomous arthropods, initial encounter: Secondary | ICD-10-CM | POA: Diagnosis not present

## 2019-02-21 DIAGNOSIS — S80862A Insect bite (nonvenomous), left lower leg, initial encounter: Secondary | ICD-10-CM | POA: Diagnosis not present

## 2019-03-27 DIAGNOSIS — I728 Aneurysm of other specified arteries: Secondary | ICD-10-CM | POA: Diagnosis not present

## 2019-03-27 DIAGNOSIS — Z86010 Personal history of colonic polyps: Secondary | ICD-10-CM | POA: Diagnosis not present

## 2019-03-27 DIAGNOSIS — R109 Unspecified abdominal pain: Secondary | ICD-10-CM | POA: Diagnosis not present

## 2019-03-27 DIAGNOSIS — R911 Solitary pulmonary nodule: Secondary | ICD-10-CM | POA: Diagnosis not present

## 2019-04-30 DIAGNOSIS — R7303 Prediabetes: Secondary | ICD-10-CM | POA: Diagnosis not present

## 2019-04-30 DIAGNOSIS — I2510 Atherosclerotic heart disease of native coronary artery without angina pectoris: Secondary | ICD-10-CM | POA: Diagnosis not present

## 2019-04-30 DIAGNOSIS — E785 Hyperlipidemia, unspecified: Secondary | ICD-10-CM | POA: Diagnosis not present

## 2019-04-30 DIAGNOSIS — J984 Other disorders of lung: Secondary | ICD-10-CM | POA: Diagnosis not present

## 2019-07-08 DIAGNOSIS — R109 Unspecified abdominal pain: Secondary | ICD-10-CM | POA: Diagnosis not present

## 2019-07-08 DIAGNOSIS — K219 Gastro-esophageal reflux disease without esophagitis: Secondary | ICD-10-CM | POA: Diagnosis not present

## 2019-07-08 DIAGNOSIS — Z86010 Personal history of colonic polyps: Secondary | ICD-10-CM | POA: Diagnosis not present

## 2019-08-26 DIAGNOSIS — M25562 Pain in left knee: Secondary | ICD-10-CM | POA: Diagnosis not present

## 2019-09-29 ENCOUNTER — Other Ambulatory Visit: Payer: Self-pay

## 2019-09-29 DIAGNOSIS — Z20822 Contact with and (suspected) exposure to covid-19: Secondary | ICD-10-CM

## 2019-10-01 LAB — NOVEL CORONAVIRUS, NAA: SARS-CoV-2, NAA: NOT DETECTED

## 2019-10-28 ENCOUNTER — Other Ambulatory Visit: Payer: Self-pay

## 2019-10-29 DIAGNOSIS — E785 Hyperlipidemia, unspecified: Secondary | ICD-10-CM | POA: Diagnosis not present

## 2019-10-29 DIAGNOSIS — R7303 Prediabetes: Secondary | ICD-10-CM | POA: Diagnosis not present

## 2019-10-29 DIAGNOSIS — J984 Other disorders of lung: Secondary | ICD-10-CM | POA: Diagnosis not present

## 2019-10-29 DIAGNOSIS — Z23 Encounter for immunization: Secondary | ICD-10-CM | POA: Diagnosis not present

## 2019-10-29 DIAGNOSIS — I2510 Atherosclerotic heart disease of native coronary artery without angina pectoris: Secondary | ICD-10-CM | POA: Diagnosis not present

## 2019-10-29 DIAGNOSIS — I728 Aneurysm of other specified arteries: Secondary | ICD-10-CM | POA: Diagnosis not present

## 2019-10-29 LAB — COMPREHENSIVE METABOLIC PANEL
ALT: 25 IU/L (ref 0–44)
AST: 26 IU/L (ref 0–40)
Albumin/Globulin Ratio: 1.7 (ref 1.2–2.2)
Albumin: 4.2 g/dL (ref 3.8–4.8)
Alkaline Phosphatase: 61 IU/L (ref 39–117)
BUN/Creatinine Ratio: 13 (ref 10–24)
BUN: 14 mg/dL (ref 8–27)
Bilirubin Total: 0.8 mg/dL (ref 0.0–1.2)
CO2: 25 mmol/L (ref 20–29)
Calcium: 8.8 mg/dL (ref 8.6–10.2)
Chloride: 104 mmol/L (ref 96–106)
Creatinine, Ser: 1.04 mg/dL (ref 0.76–1.27)
GFR calc Af Amer: 85 mL/min/{1.73_m2} (ref >59)
GFR calc non Af Amer: 73 mL/min/{1.73_m2} (ref >59)
Globulin, Total: 2.5 g/dL (ref 1.5–4.5)
Glucose: 100 mg/dL — ABNORMAL HIGH (ref 65–99)
Potassium: 4.3 mmol/L (ref 3.5–5.2)
Sodium: 141 mmol/L (ref 134–144)
Total Protein: 6.7 g/dL (ref 6.0–8.5)

## 2019-10-29 LAB — PSA: Prostate Specific Ag, Serum: 0.7 ng/mL (ref 0.0–4.0)

## 2019-10-29 LAB — LIPID PANEL W/O CHOL/HDL RATIO
Cholesterol, Total: 168 mg/dL (ref 100–199)
HDL: 47 mg/dL (ref >39)
LDL Chol Calc (NIH): 104 mg/dL — ABNORMAL HIGH (ref 0–99)
Triglycerides: 89 mg/dL (ref 0–149)
VLDL Cholesterol Cal: 17 mg/dL (ref 5–40)

## 2019-10-29 LAB — SPECIMEN STATUS REPORT

## 2019-10-29 LAB — HGB A1C W/O EAG: Hgb A1c MFr Bld: 5.5 % (ref 4.8–5.6)

## 2019-11-26 DIAGNOSIS — J988 Other specified respiratory disorders: Secondary | ICD-10-CM | POA: Diagnosis not present

## 2019-12-09 ENCOUNTER — Other Ambulatory Visit: Payer: Self-pay

## 2019-12-09 ENCOUNTER — Ambulatory Visit (INDEPENDENT_AMBULATORY_CARE_PROVIDER_SITE_OTHER): Payer: 59

## 2019-12-09 ENCOUNTER — Encounter: Payer: Self-pay | Admitting: Internal Medicine

## 2019-12-09 ENCOUNTER — Ambulatory Visit (INDEPENDENT_AMBULATORY_CARE_PROVIDER_SITE_OTHER): Payer: 59 | Admitting: Internal Medicine

## 2019-12-09 DIAGNOSIS — R0600 Dyspnea, unspecified: Secondary | ICD-10-CM

## 2019-12-09 DIAGNOSIS — R0609 Other forms of dyspnea: Secondary | ICD-10-CM | POA: Insufficient documentation

## 2019-12-09 LAB — CBC WITH DIFFERENTIAL/PLATELET
Basophils Absolute: 0 10*3/uL (ref 0.0–0.1)
Basophils Relative: 0.5 % (ref 0.0–3.0)
Eosinophils Absolute: 0.1 10*3/uL (ref 0.0–0.7)
Eosinophils Relative: 1.1 % (ref 0.0–5.0)
HCT: 44.5 % (ref 39.0–52.0)
Hemoglobin: 14.7 g/dL (ref 13.0–17.0)
Lymphocytes Relative: 28 % (ref 12.0–46.0)
Lymphs Abs: 1.4 10*3/uL (ref 0.7–4.0)
MCHC: 33 g/dL (ref 30.0–36.0)
MCV: 96.2 fl (ref 78.0–100.0)
Monocytes Absolute: 0.7 10*3/uL (ref 0.1–1.0)
Monocytes Relative: 13.9 % — ABNORMAL HIGH (ref 3.0–12.0)
Neutro Abs: 2.9 10*3/uL (ref 1.4–7.7)
Neutrophils Relative %: 56.5 % (ref 43.0–77.0)
Platelets: 215 10*3/uL (ref 150.0–400.0)
RBC: 4.63 Mil/uL (ref 4.22–5.81)
RDW: 13.5 % (ref 11.5–15.5)
WBC: 5.2 10*3/uL (ref 4.0–10.5)

## 2019-12-09 LAB — BASIC METABOLIC PANEL
BUN: 19 mg/dL (ref 6–23)
CO2: 29 mEq/L (ref 19–32)
Calcium: 9.2 mg/dL (ref 8.4–10.5)
Chloride: 104 mEq/L (ref 96–112)
Creatinine, Ser: 1.08 mg/dL (ref 0.40–1.50)
GFR: 67.86 mL/min (ref >60.00)
Glucose, Bld: 111 mg/dL — ABNORMAL HIGH (ref 70–99)
Potassium: 3.8 mEq/L (ref 3.5–5.1)
Sodium: 139 mEq/L (ref 135–145)

## 2019-12-09 LAB — TSH: TSH: 3.16 u[IU]/mL (ref 0.35–4.50)

## 2019-12-09 LAB — BRAIN NATRIURETIC PEPTIDE: Pro B Natriuretic peptide (BNP): 15 pg/mL (ref 0.0–100.0)

## 2019-12-09 NOTE — Progress Notes (Signed)
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 moved from  [place]  to [facility] in 2008 then developed intermittent rhinitis year round pattern  never eval by allergist and better p clariton but persistent daily pnds did not resolve on clariton - developed short of breath / chest congestion > [place]/ GI dx reflux > complete resolution on omeprazole once a day and fine with good ex tol including yard work abruptly more sob p yard work early Feb 2021 assoc "constant"  chest tightness and  New hoarseness  with longstanding pnds which is not different from baseline nor any assoc cough or nasal congestions vs baseline referred to pulmonary clinic 12/09/2019 by Dr London Pepper  p rx doxy > no better and    History of Present Illness  12/09/2019  Pulmonary/ 1st office eval/Jeannette Maddy  Chief Complaint  Patient presents with  . Pulmonary Consult    Referred by Dr Orland Mustard for increased SOB over the past 2 wks. He states he gets winded walking up stairs. He feels a pressure in his chest and states occ feels lightheaded.   Dyspnea:  Not as active / gave up yard work  Cough: clearing thorat Sleep: fine 30 degrees but snores  SABA use: none Symptoms present while awake = tight chest, light headed no worse with exercise.  One brother 30 MI / smoker    No obvious day to day or daytime variability or assoc excess/ purulent sputum or mucus plugs or hemoptysis or   subjective wheeze or overt sinus   symptoms.   Sleeping as above  without nocturnal  or early am exacerbation  of respiratory  c/o's or need for noct saba. Also denies any obvious fluctuation of symptoms with weather or environmental changes or other aggravating or alleviating factors except as outlined above   No unusual exposure hx or h/o childhood pna/ asthma or knowledge of premature birth.  Current Allergies, Complete Past Medical History, Past Surgical History, Family History, and Social  History were reviewed in [employer] record.  ROS  The following are not active complaints unless bolded Hoarseness, sore throat, dysphagia, dental problems, itching, sneezing,  nasal congestion or discharge of excess mucus or purulent secretions, ear ache,   fever, chills, sweats, unintended wt loss or wt gain, classically pleuritic or exertional cp,  orthopnea pnd or arm/hand swelling  or leg swelling, presyncope, palpitations, abdominal pain, anorexia, nausea, vomiting, diarrhea  or change in bowel habits or change in bladder habits, change in stools or change in urine, dysuria, hematuria,  rash, arthralgias, visual complaints, headache, numbness, weakness or ataxia or problems with walking or coordination,  change in mood= anxious or  memory.           Past Medical History:  Diagnosis Date  . 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 GERD (gastroesophageal reflux disease)   . H/O asbestos exposure   . H/O prostatitis   . IBS (irritable bowel syndrome)   . Pericarditis   . Pulmonary nodule   . SOB (shortness of breath) on exertion     Outpatient Medications Prior to Visit  Medication Sig Dispense Refill  . aspirin 81 MG tablet Take 81 mg by mouth daily.    . Calcium Carbonate (CALCIUM 600 PO) Take 1 capsule by mouth daily.    . Cholecalciferol (VITAMIN D-3) 5000 units TABS Take by mouth.    Marland Kitchen  Coenzyme Q10 (CO Q-10) 100 MG CAPS Take 100 mg by mouth daily.     Marland Kitchen doxycycline (VIBRA-TABS) 100 MG tablet Take 1 tablet by mouth in the morning and at bedtime.    Marland Kitchen ezetimibe (ZETIA) 10 MG tablet Take 10 mg by mouth daily.    . Glucosamine-Chondroit-Vit C-Mn (GLUCOSAMINE 1500 COMPLEX PO) Take 1 capsule by mouth daily.    . lansoprazole (PREVACID) 30 MG capsule Take 30 mg by mouth daily.     . Multiple Vitamin (MULTIVITAMIN) tablet Take 1 tablet by mouth daily.    . Multiple Vitamins-Minerals (ICAPS AREDS FORMULA PO) Take 1 tablet by mouth daily.    . Multiple Vitamins-Minerals  (PRESERVISION AREDS PO) Take by mouth.    . polyethylene glycol powder (GLYCOLAX/MIRALAX) powder Take 17 g by mouth daily as needed for mild constipation.   0  . rosuvastatin (CRESTOR) 5 MG tablet Take 5 mg by mouth every other day.    . vitamin B-12 (CYANOCOBALAMIN) 500 MCG tablet Take 500 mcg by mouth daily.    . vitamin E (VITAMIN E) 180 MG (400 UNITS) capsule Take 400 Units by mouth daily.    . colchicine 0.6 MG tablet Take 1 tablet (0.6 mg total) by mouth 2 (two) times daily. (Patient not taking: Reported on 03/31/2016) 60 tablet 1  . cyclobenzaprine (FLEXERIL) 5 MG tablet Take 5 mg by mouth 2 (two) times daily.    . methocarbamol (ROBAXIN) 500 MG tablet Take 500 mg by mouth 2 (two) times daily.    . sucralfate (CARAFATE) 1 G tablet Take 1 tablet by mouth 4 (four) times daily - after meals and at bedtime. Reported on 03/31/2016  0  . traMADol (ULTRAM) 50 MG tablet Take 50 mg by mouth every 6 (six) hours as needed.           Objective:     BP 124/68 (BP Location: Left Arm, Cuff Size: Normal)   Pulse 68   Temp (!) 97.3 F (36.3 C) (Temporal)   Ht 6' (1.829 m)   Wt 181 lb (82.1 kg)   SpO2 97% Comment: on RA  BMI 24.55 kg/m   SpO2: 97 %(on RA)   Pleasant quite verbose/ hoarse amb wm nad    HEENT : pt wearing mask not removed for exam due to covid -19 concerns.    NECK :  without JVD/Nodes/TM/ nl carotid upstrokes bilaterally   LUNGS: no acc muscle use,  Nl contour chest which is clear to A and P bilaterally without cough on insp or exp maneuvers   CV:  RRR  no s3 or murmur or increase in P2, and no edema   ABD:  soft and nontender with nl inspiratory excursion in the supine position. No bruits or organomegaly appreciated, bowel sounds nl  MS:  Nl gait/ ext warm without deformities, calf tenderness, cyanosis or clubbing No obvious joint restrictions   SKIN: warm and dry without lesions    NEURO:  alert, approp, nl sensorium with  no motor or cerebellar deficits  apparent.    CXR PA and Lateral:   12/09/2019 :    I personally reviewed images and   impression as follows:   slt hyperinflation but o/w wnl   Labs ordered/ reviewed:      Chemistry      Component Value Date/Time   NA 139 12/09/2019 1220   NA 141 10/28/2019 0808   K 3.8 12/09/2019 1220   CL 104 12/09/2019 1220   CO2 29  12/09/2019 1220   BUN 19 12/09/2019 1220   BUN 14 10/28/2019 0808   CREATININE 1.08 12/09/2019 1220      Component Value Date/Time   CALCIUM 9.2 12/09/2019 1220   ALKPHOS 61 10/28/2019 0808   AST 26 10/28/2019 0808   ALT 25 10/28/2019 0808   BILITOT 0.8 10/28/2019 0808        Lab Results  Component Value Date   WBC 5.2 12/09/2019   HGB 14.7 12/09/2019   HCT 44.5 12/09/2019   MCV 96.2 12/09/2019   PLT 215.0 12/09/2019       EOS                                                              0.1                                      12/09/2019   Lab Results  Component Value Date   DDIMER 0.22 12/09/2019      Lab Results  Component Value Date   TSH 3.16 12/09/2019     Lab Results  Component Value Date   PROBNP 15.0 12/09/2019        ekg p ex:  sr with single pvc, no ischemic changes   Labs ordered 12/09/2019  :  Trop 1         Assessment   DOE (dyspnea on exertion) Onset early Feb 2021 assoc with hoarsness/ palpitations - PFTs 06/03/09 supra nl FEV1 with increased lung volumes - 12/09/2019   Walked RA x two laps =  approx 518ft @ very fast pace - stopped due to end of study with sats of 96 % at the end of the study then became more sob when sat down > ekg sr with one pvc, no ischemic change   Symptoms are markedly disproportionate to objective findings and not clear to what extent this is actually a pulmonary  problem but pt does appear to have difficult to sort out respiratory symptoms of unknown origin for which  DDX  = almost all start with A and  include Adherence, Ace Inhibitors, Acid Reflux, Active Sinus Disease, Alpha 1 Antitripsin  deficiency, Anxiety masquerading as Airways dz,  ABPA,  Allergy(esp in young), Aspiration (esp in elderly), Adverse effects of meds,  Active smoking or Vaping, A bunch of PE's/clot burden (a few small clots can't cause this syndrome unless there is already severe underlying pulm or vascular dz with poor reserve),  Anemia or thyroid disorder, plus two Bs  = Bronchiectasis and Beta blocker use..and one C= CHF    Adherence is always the initial "prime suspect" and is a multilayered concern that requires a "trust but verify" approach in every patient - starting with knowing how to use medications, especially inhalers, correctly, keeping up with refills and understanding the fundamental difference between maintenance and prns vs those medications only taken for a very short course and then stopped and not refilled.  - return with all meds in hand using a trust but verify approach to confirm accurate Medication  Reconciliation The principal here is that until we are certain that the  patients are doing what we've asked, it makes no sense to ask  them to do more.   ? Acid (or non-acid) GERD > always difficult to exclude as up to 75% of pts in some series report no assoc GI/ Heartburn symptoms assoc with hoarseness and atypical cp > rec max (24h)  acid suppression and diet restrictions/ reviewed and instructions given in writing.   ? Allergy/asthma > no assoc noct symptoms, no eos noted > check IgE  ?Actice sinus dz/ rhinitis > try zyrtec   ? ? Anxiety > usually at the bottom of this list of usual suspects but should be included here and may interfere with adherence and also interpretation of response or lack thereof to symptom management which can be quite subjective.   ? Adverse effects of meds > none of the usual suspects listed   D dimer nl - while a normal  or high normal value (seen commonly in the elderly or chronically ill)  may miss small peripheral pe, the clot burden with sob is moderately high and  the d dimer  has a very high neg pred value if used in this setting.     ? chf /ihd > nl bnp noted, nl ekg p exercise x for single pvc   >>>  If not better with increased rx for gerd/rhinitis needs cpst next   Total time devoted to counseling  > 50 % of initial 60 min office visit:  reviewed case with pt/  directly observedportions of ambulatory 02 saturation study/ discussion of options/alternatives/ personally creating written customized instructions  in presence of pt  then going over those specific  Instructions directly with the pt including how to use all of the meds but in particular covering each new medication in detail and the difference between the maintenance= "automatic" meds and the prns using an action plan format for the latter (If this problem/symptom => do that organization reading Left to right).  Please see AVS from this visit for a full list of these instructions which I personally wrote for this pt and  are unique to this visit.     Christinia Gully, MD 12/09/2019

## 2019-12-09 NOTE — Patient Instructions (Addendum)
Increase omeprazole to Take 30- 60 min before your first and last meals of the day   Zyrtec 10 mg at bedtime (over the counter)  GERD (REFLUX)  is an extremely common cause of respiratory symptoms just like yours,  many times with no obvious heartburn at all.    It can be treated with medication, but also with lifestyle changes including elevation of the head of your bed (ideally with 6 -8inch blocks under the headboard of your bed),  Smoking cessation, avoidance of late meals, excessive alcohol, and avoid fatty foods, chocolate, peppermint, colas, red wine, and acidic juices such as orange juice.  NO MINT OR MENTHOL PRODUCTS SO NO COUGH DROPS  USE SUGARLESS CANDY INSTEAD (Jolley ranchers or Stover's or Life Savers) or even ice chips will also do - the key is to swallow to prevent all throat clearing. NO OIL BASED VITAMINS - use powdered substitutes.  Avoid fish oil when coughing.    Please remember to go to the lab and x-ray department   for your tests - we will call you with the results when they are available and go from there in terms of additonal recs.

## 2019-12-10 ENCOUNTER — Encounter: Payer: Self-pay | Admitting: Internal Medicine

## 2019-12-10 LAB — D-DIMER, QUANTITATIVE: D-Dimer, Quant: 0.22 mcg/mL FEU (ref <0.50)

## 2019-12-10 LAB — IGE: IgE (Immunoglobulin E), Serum: 11 kU/L (ref <=114)

## 2019-12-10 NOTE — Progress Notes (Signed)
Spoke with pt and notified of results per Dr. Wert. Pt verbalized understanding and denied any questions. 

## 2019-12-10 NOTE — Progress Notes (Signed)
LMTCB

## 2019-12-10 NOTE — Assessment & Plan Note (Signed)
Onset early Feb 2021 assoc with hoarsness/ palpitations - PFTs 06/03/09 supra nl FEV1 with increased lung volumes - 12/09/2019   Walked RA x two laps =  approx 546ft @ very fast pace - stopped due to end of study with sats of 96 % at the end of the study then became more sob when sat down > ekg sr with one pvc, no ischemic change   Symptoms are markedly disproportionate to objective findings and not clear to what extent this is actually a pulmonary  problem but pt does appear to have difficult to sort out respiratory symptoms of unknown origin for which  DDX  = almost all start with A and  include Adherence, Ace Inhibitors, Acid Reflux, Active Sinus Disease, Alpha 1 Antitripsin deficiency, Anxiety masquerading as Airways dz,  ABPA,  Allergy(esp in young), Aspiration (esp in elderly), Adverse effects of meds,  Active smoking or Vaping, A bunch of PE's/clot burden (a few small clots can't cause this syndrome unless there is already severe underlying pulm or vascular dz with poor reserve),  Anemia or thyroid disorder, plus two Bs  = Bronchiectasis and Beta blocker use..and one C= CHF    Adherence is always the initial "prime suspect" and is a multilayered concern that requires a "trust but verify" approach in every patient - starting with knowing how to use medications, especially inhalers, correctly, keeping up with refills and understanding the fundamental difference between maintenance and prns vs those medications only taken for a very short course and then stopped and not refilled.  - return with all meds in hand using a trust but verify approach to confirm accurate Medication  Reconciliation The principal here is that until we are certain that the  patients are doing what we've asked, it makes no sense to ask them to do more.   ? Acid (or non-acid) GERD > always difficult to exclude as up to 75% of pts in some series report no assoc GI/ Heartburn symptoms assoc with hoarseness and atypical cp > rec max  (24h)  acid suppression and diet restrictions/ reviewed and instructions given in writing.   ? Allergy/asthma > no assoc noct symptoms, no eos noted > check IgE  ?Actice sinus dz/ rhinitis > try zyrtec   ? ? Anxiety > usually at the bottom of this list of usual suspects but should be included here and may interfere with adherence and also interpretation of response or lack thereof to symptom management which can be quite subjective.   ? Adverse effects of meds > none of the usual suspects listed   D dimer nl - while a normal  or high normal value (seen commonly in the elderly or chronically ill)  may miss small peripheral pe, the clot burden with sob is moderately high and the d dimer  has a very high neg pred value if used in this setting.     ? chf /ihd > nl bnp noted, nl ekg p exercise x for single pvc   If not better with increased rx for gerd/rhinitis needs cpst   Total time devoted to counseling  > 50 % of initial 60 min office visit:  reviewed case with pt/  directly observedportions of ambulatory 02 saturation study/ discussion of options/alternatives/ personally creating written customized instructions  in presence of pt  then going over those specific  Instructions directly with the pt including how to use all of the meds but in particular covering each new medication in detail and the  difference between the maintenance= "automatic" meds and the prns using an action plan format for the latter (If this problem/symptom => do that organization reading Left to right).  Please see AVS from this visit for a full list of these instructions which I personally wrote for this pt and  are unique to this visit.

## 2019-12-29 ENCOUNTER — Ambulatory Visit (INDEPENDENT_AMBULATORY_CARE_PROVIDER_SITE_OTHER): Payer: 59 | Admitting: Acute Care

## 2019-12-29 ENCOUNTER — Other Ambulatory Visit: Payer: Self-pay

## 2019-12-29 ENCOUNTER — Encounter: Payer: Self-pay | Admitting: Acute Care

## 2019-12-29 VITALS — BP 122/78 | HR 72 | Temp 97.6°F | Ht 72.0 in | Wt 182.6 lb

## 2019-12-29 DIAGNOSIS — K219 Gastro-esophageal reflux disease without esophagitis: Secondary | ICD-10-CM

## 2019-12-29 DIAGNOSIS — I509 Heart failure, unspecified: Secondary | ICD-10-CM

## 2019-12-29 DIAGNOSIS — J849 Interstitial pulmonary disease, unspecified: Secondary | ICD-10-CM | POA: Diagnosis not present

## 2019-12-29 NOTE — Progress Notes (Signed)
History of Present Illness Leonard Noble is a 69 y.o. male never smoker with exertional dyspnea. He is followed by Dr. Melvyn Novas.   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  Abruptly more sob p yard work early Feb 2021 assoc "constant"  chest tightness and  New hoarseness  with longstanding pnds which is not different from baseline nor any assoc cough or nasal congestions vs baseline referred to pulmonary clinic 12/09/2019 by Dr London Pepper  p rx doxy > no better . Referred to Dr. Melvyn Novas 11/2019 for increased SOB x 2 weeks. He was seen 12/09/2019. Dyspnea:  Not as active / gave up yard work  Cough: clearing thorat Sleep: fine 30 degrees but snores  SABA use: none Symptoms present while awake = tight chest, light headed no worse with exercise.    01/06/2020  Pt. Presents for follow up of dyspnea with exertion. Pt. Was seen by Dr. Melvyn Novas 12/09/2019. He increased his omeprazole and started him on Zyrtec. He states he does still have exertional dyspnea. He states he has not noted any improvement with the increase in omeprazole , or Zyrtec at bedtime that Dr. Melvyn Novas added at his last OV. Labs have been reviewed. He is in agreement with additional diagnostics for better evaluate etiology of dyspnea. He has not had an echo since 2016, and last CT Chest was 2018.  He denies any fever, chest pain, orthopnea or hemoptysis. No recent car or plane travel. He is in no distress.  Sats are 98% on RA.   Test Results: Labs 12/09/2019 IgE>> 11 TSH 3.16 D-dimer 0.22 Eosinophils 1.1 BNP 15  Echo 07/18/2015>> EF 35-40% Left ventricle: The cavity size was  normal. Systolic function was  moderately reduced. The estimated ejection fraction was in the  range of 35% to 40%. Diffuse hypokinesis. Doppler parameters are  consistent with abnormal left ventricular relaxation (grade 1  diastolic dysfunction).  - Mitral valve: Calcified annulus. There was moderate  regurgitation.  - Left atrium: The atrium was mildly dilated.  - Right ventricle: Systolic function was mildly reduced.  - Right atrium: The atrium was mildly dilated.  - Pulmonary arteries: Systolic pressure was mildly increased. PA  peak pressure: 38 mm Hg (S).   CTA Chest 07/17/2015 No significant lymphadenopathy in the chest. Emphysematous changes and slight fibrosis in the lungs. No focal airspace disease or consolidation. Subpleural nodule in the left lung base measuring 6 mm is unchanged since previous study. Nodule in the right costophrenic angle measuring 5.6 is also unchanged. No pleural effusions. No pneumothorax.   CBC Latest Ref Rng & Units 12/09/2019 12/26/2017 07/18/2015  WBC 4.0 - 10.5 K/uL 5.2 5.8 7.3  Hemoglobin 13.0 - 17.0 g/dL 14.7 14.4 13.8  Hematocrit 39.0 - 52.0 % 44.5 42.2 40.3  Platelets 150.0 - 400.0 K/uL 215.0 226 144(L)    BMP Latest Ref Rng & Units 12/09/2019 10/28/2019 12/26/2017  Glucose 70 - 99 mg/dL 111(H) 100(H) 109(H)  BUN 6 - 23 mg/dL 19 14 16   Creatinine 0.40 - 1.50 mg/dL 1.08 1.04 1.03  BUN/Creat Ratio 10 - 24 - 13 -  Sodium 135 - 145 mEq/L 139 141 139  Potassium 3.5 - 5.1 mEq/L 3.8 4.3 3.6  Chloride 96 - 112 mEq/L 104 104 104  CO2 19 - 32 mEq/L 29 25 25   Calcium 8.4 - 10.5 mg/dL 9.2 8.8 9.0    BNP No results found for: BNP  ProBNP    Component Value Date/Time   PROBNP 15.0 12/09/2019 1220    PFT No results found for: FEV1PRE, FEV1POST, FVCPRE, FVCPOST, TLC, DLCOUNC, PREFEV1FVCRT, PSTFEV1FVCRT  DG Chest 2 View  Result Date: 12/09/2019 CLINICAL DATA:  Dyspnea on exertion EXAM: CHEST - 2 VIEW COMPARISON:  10/21/2018 FINDINGS:  Cardiac shadows within normal limits. Mild aortic calcifications are seen. Lungs are hyperaerated bilaterally without focal infiltrate. Mild degenerative changes of the thoracic spine are noted. Cervical spine demonstrates stable fusion changes IMPRESSION: COPD without acute abnormality. Electronically Signed   By: Inez Catalina M.D.   On: 12/09/2019 19:37     Past medical hx Past Medical History:  Diagnosis Date  . Celiac artery aneurysm (Zap)   . GERD (gastroesophageal reflux disease)   . H/O asbestos exposure   . H/O prostatitis   . IBS (irritable bowel syndrome)   . Pericarditis   . Pulmonary nodule   . SOB (shortness of breath) on exertion      Social History   Tobacco Use  . Smoking status: Never Smoker  . Smokeless tobacco: Never Used  Substance Use Topics  . Alcohol use: Yes    Alcohol/week: 2.0 standard drinks    Types: 2 Glasses of wine per week  . Drug use: No    Mr.Archambault reports that he has never smoked. He has never used smokeless tobacco. He reports current alcohol use of about 2.0 standard drinks of alcohol per week. He reports that he does not use drugs.  Tobacco Cessation: Never Smoker  Past surgical hx, Family hx, Social hx all reviewed.  Current Outpatient Medications on File Prior to Visit  Medication Sig  . aspirin 81 MG tablet Take 81 mg by mouth daily.  . Calcium Carbonate (CALCIUM 600 PO) Take 1 capsule by mouth daily.  . Cholecalciferol (VITAMIN D-3) 5000 units TABS Take by mouth.  . Coenzyme Q10 (CO Q-10) 100 MG CAPS Take 100 mg by mouth daily.   Marland Kitchen ezetimibe (ZETIA) 10 MG tablet Take 10 mg by mouth daily.  . Glucosamine-Chondroit-Vit C-Mn (GLUCOSAMINE 1500 COMPLEX PO) Take 1 capsule by mouth daily.  . lansoprazole (PREVACID) 30 MG capsule Take 30 mg by mouth daily.   . Multiple Vitamin (MULTIVITAMIN) tablet Take 1 tablet by mouth daily.  . Multiple Vitamins-Minerals (ICAPS AREDS FORMULA PO) Take 1 tablet by mouth daily.  . Multiple  Vitamins-Minerals (PRESERVISION AREDS PO) Take by mouth.  . polyethylene glycol powder (GLYCOLAX/MIRALAX) powder Take 17 g by mouth daily as needed for mild constipation.   . rosuvastatin (CRESTOR) 5 MG tablet Take 5 mg by mouth every other day.  . vitamin B-12 (CYANOCOBALAMIN) 500 MCG tablet Take 500 mcg by mouth daily.  . vitamin E (VITAMIN E) 180 MG (400 UNITS) capsule Take 400 Units by mouth daily.   No current facility-administered medications on file prior to visit.     Allergies  Allergen Reactions  . Epinephrine     REACTION: PVC's  . Bactrim [Sulfamethoxazole-Trimethoprim]     Nauseous, shortness of breath    Review Of Systems:  Constitutional:   No  weight loss, night sweats,  Fevers, chills, fatigue, or  lassitude.  HEENT:  No headaches,  Difficulty swallowing,  Tooth/dental problems, or  Sore throat,                No sneezing, itching, ear ache, nasal congestion, post nasal drip,   CV:  No chest pain,  Orthopnea, PND, swelling in lower extremities, anasarca, dizziness, palpitations, syncope.   GI  No heartburn, indigestion, abdominal pain, nausea, vomiting, diarrhea, change in bowel habits, loss of appetite, bloody stools.   Resp: + shortness of breath with exertion or at rest.  No excess mucus, no productive cough,  No non-productive cough,  No coughing up of blood.  No change in color of mucus.  No wheezing.  No chest wall deformity  Skin: no rash or lesions.  GU: no dysuria, change in color of urine, no urgency or frequency.  No flank pain, no hematuria   MS:  No joint pain or swelling.  No decreased range of motion.  No back pain.  Psych:  No change in mood or affect. No depression or anxiety.  No memory loss.   Vital Signs BP 122/78 (BP Location: Left Arm, Cuff Size: Normal)   Pulse 72   Temp 97.6 F (36.4 C) (Temporal)   Ht 6' (1.829 m)   Wt 182 lb 9.6 oz (82.8 kg)   SpO2 98%   BMI 24.77 kg/m    Physical Exam:  General- No distress,  A&Ox3,  pleasant ENT: No sinus tenderness, TM clear, pale nasal mucosa, no oral exudate,no post nasal drip, no LAN Cardiac: S1, S2, regular rate and rhythm, no murmur Chest: No wheeze/ rales/ dullness; no accessory muscle use, no nasal flaring, no sternal retractions Abd.: Soft Non-tender, ND, BS +, Body mass index is 24.77 kg/m. Ext: No clubbing cyanosis, No edema Neuro:  normal strength, A&O x 3, MAE x 4 Skin: No rashes, warm and dry Psych: normal mood and behavior   Assessment/Plan DOE (dyspnea on exertion) Onset early Feb 2021 assoc with hoarsness/ palpitations - PFTs 06/03/09 supra nl FEV1 with increased lung volumes - 12/09/2019   Walked RA x two laps =  approx 551ft @ very fast pace - stopped due to end of study with sats of 96 % at the end of the study then became more sob when sat down > ekg sr with one pvc, no ischemic change Plan We will schedule a 2 D Echo to evaluate your Pulmonary Artery Pressures.  We will order a pulmonary function test.  You will get Covid Testing prior.  We will schedule a HRCT of your chest  to evaluate for any fibrotic changes. We will consider evaluating you for sleep apnea as you snore.  Follow up in 1 month with Dr. Melvyn Novas or Judson Roch NP Please contact office for sooner follow up if symptoms do not improve or worsen or seek emergency care     GERD Max acid suppression Continue your Omeprazole as Dr. Melvyn Novas prescribed  Continue Zyrtec as you have been doing.   CHF>> Normal BNP, Normal EKG after exercise Plan We will check an echo>> last done 2016   This appointment was 30 min long with over 50% of the time in direct face-to-face patient care, assessment, plan of care, and follow-up.   Date of Service was 01/06/2020    Magdalen Spatz, NP 01/06/2020  3:41 PM

## 2019-12-29 NOTE — Patient Instructions (Addendum)
It is good to see you today. Please schedule you Covid vaccine.  We will schedule a 2 D Echo to evaluate your Pulmonary Artery Pressures.  We will order a pulmonary function test.  You will get Covid Testing prior.  We will schedule a HRCT of your chest  to evaluate for any fibrotic changes. Continue your Omeprazole as Dr. Melvyn Novas prescribed  Continue Zyrtec as you have been doing. We will consider evaluating you for sleep apnea as you snore.  Follow up in 1 month with Dr. Melvyn Novas or Judson Roch NP Please contact office for sooner follow up if symptoms do not improve or worsen or seek emergency care

## 2020-01-13 ENCOUNTER — Encounter: Payer: Self-pay | Admitting: Acute Care

## 2020-01-23 ENCOUNTER — Other Ambulatory Visit (HOSPITAL_COMMUNITY)
Admission: RE | Admit: 2020-01-23 | Discharge: 2020-01-23 | Disposition: A | Discharge status: Home / Self Care | Payer: 59 | Source: Ambulatory Visit | Attending: Pulmonary Disease | Admitting: Pulmonary Disease

## 2020-01-23 DIAGNOSIS — Z01812 Encounter for preprocedural laboratory examination: Secondary | ICD-10-CM | POA: Diagnosis not present

## 2020-01-23 DIAGNOSIS — Z20822 Contact with and (suspected) exposure to covid-19: Secondary | ICD-10-CM | POA: Diagnosis not present

## 2020-01-23 LAB — SARS CORONAVIRUS 2 (TAT 6-24 HRS): SARS Coronavirus 2: NEGATIVE

## 2020-01-26 ENCOUNTER — Ambulatory Visit (INDEPENDENT_AMBULATORY_CARE_PROVIDER_SITE_OTHER): Payer: 59 | Admitting: Pulmonary Disease

## 2020-01-26 ENCOUNTER — Other Ambulatory Visit: Payer: Self-pay

## 2020-01-26 DIAGNOSIS — J849 Interstitial pulmonary disease, unspecified: Secondary | ICD-10-CM | POA: Diagnosis not present

## 2020-01-26 LAB — PULMONARY FUNCTION TEST
DL/VA % pred: 89 %
DL/VA: 3.63 ml/min/mmHg/L
DLCO cor % pred: 105 %
DLCO cor: 29.34 ml/min/mmHg
DLCO unc % pred: 105 %
DLCO unc: 29.42 ml/min/mmHg
FEF 25-75 Post: 3.58 L/sec
FEF 25-75 Pre: 3.09 L/sec
FEF2575-%Change-Post: 16 %
FEF2575-%Pred-Post: 129 %
FEF2575-%Pred-Pre: 111 %
FEV1-%Change-Post: 3 %
FEV1-%Pred-Post: 118 %
FEV1-%Pred-Pre: 114 %
FEV1-Post: 4.24 L
FEV1-Pre: 4.11 L
FEV1FVC-%Change-Post: 2 %
FEV1FVC-%Pred-Pre: 100 %
FEV6-%Change-Post: 0 %
FEV6-%Pred-Post: 121 %
FEV6-%Pred-Pre: 120 %
FEV6-Post: 5.56 L
FEV6-Pre: 5.53 L
FEV6FVC-%Change-Post: 0 %
FEV6FVC-%Pred-Post: 105 %
FEV6FVC-%Pred-Pre: 105 %
FVC-%Change-Post: 0 %
FVC-%Pred-Post: 115 %
FVC-%Pred-Pre: 114 %
FVC-Post: 5.57 L
FVC-Pre: 5.54 L
Post FEV1/FVC ratio: 76 %
Post FEV6/FVC ratio: 100 %
Pre FEV1/FVC ratio: 74 %
Pre FEV6/FVC Ratio: 100 %
RV % pred: 145 %
RV: 3.66 L
TLC % pred: 121 %
TLC: 9.01 L

## 2020-01-26 NOTE — Progress Notes (Signed)
Full PFT performed today. °

## 2020-01-27 ENCOUNTER — Ambulatory Visit
Admission: RE | Admit: 2020-01-27 | Discharge: 2020-01-27 | Disposition: A | Discharge status: Home / Self Care | Payer: 59 | Source: Ambulatory Visit | Attending: Acute Care | Admitting: Acute Care

## 2020-01-27 DIAGNOSIS — J849 Interstitial pulmonary disease, unspecified: Secondary | ICD-10-CM

## 2020-01-28 ENCOUNTER — Ambulatory Visit: Payer: 59 | Admitting: Acute Care

## 2020-01-28 ENCOUNTER — Ambulatory Visit (INDEPENDENT_AMBULATORY_CARE_PROVIDER_SITE_OTHER): Payer: 59 | Admitting: Acute Care

## 2020-01-28 ENCOUNTER — Other Ambulatory Visit: Payer: Self-pay

## 2020-01-28 ENCOUNTER — Encounter: Payer: Self-pay | Admitting: Acute Care

## 2020-01-28 DIAGNOSIS — R05 Cough: Secondary | ICD-10-CM

## 2020-01-28 DIAGNOSIS — R059 Cough, unspecified: Secondary | ICD-10-CM

## 2020-01-28 DIAGNOSIS — R0600 Dyspnea, unspecified: Secondary | ICD-10-CM | POA: Diagnosis not present

## 2020-01-28 NOTE — Progress Notes (Signed)
Virtual Visit via Telephone Note  I connected with Leonard Noble on 01/28/20 at  2:30 PM EDT by telephone and verified that I am speaking with the correct person using two identifiers.  Location: Patient: At home Provider: Working remotely from home   I discussed the limitations, risks, security and privacy concerns of performing an evaluation and management service by telephone and the availability of in person appointments. I also discussed with the patient that there may be a patient responsible charge related to this service. The patient expressed understanding and agreed to proceed.  Synopsis: Leonard Noble is a 69 y.o. male never smoker with exertional dyspnea. He is followed by Dr. Melvyn Novas.  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  Abruptly more sob p yard work early Feb 2021assoc"constant"chest tightness and Newhoarseness with longstanding pnds which is not different from baseline nor any assoc cough or nasal congestions vs baselinereferred to pulmonary clinic2/16/2021by Dr Vivia Ewing rx doxy > no better . Referred to Dr. Melvyn Novas 11/2019 for increased SOB x 2 weeks. He was seen 12/09/2019. Dyspnea:Not as active / gave up yard work Cough:clearing thorat Sleep:fine 30 degrees but snores SABA [id number] Symptoms present while awake = tight chest, light headed no worse with exercise.   History of Present Illness: Pt. Presents for follow up. He was seen for dyspnea with exertion 12/29/2019. Echo, PFT's and HRCT were ordered. Echo ordered is pending PFT's show no obstruction,and DLCO of 105% predicted. Good BD   reversibility in the midflows HRCT shows no ILD, + Scattered small parenchymal bands at the dependent lung bases bilaterally, compatible with nonspecific mild postinfectious/postinflammatory scarring. Pt. States the chest pressure is better, but he does have some raspiness still.He does state that his shortness of breath is improving. We reviewed PFT results and HRCT results   Observations/Objective: HRCT 01/27/2020 No evidence of interstitial lung disease. 2. Scattered small parenchymal bands at the dependent lung bases bilaterally, compatible with nonspecific mild postinfectious/postinflammatory scarring. 3. Solitary 5 mm solid right lower lobe pulmonary nodule. No follow-up needed if patient is low-risk. Non-contrast chest CT can be considered in 12 months if patient is high-risk    PFT's 01/26/2020 ---- SPIROMETRY ---- FVC (L) 5.54 4.84 114 5.57 115 +0 FEV1 (L) 4.11 3.59 114 4.24 118 +3 FEV1/FVC (%) 74 74 100 76 102 +2 FEV6 (L) 5.53 4.59 120 5.56 121 +0 FEV1/FEV6 (%) 74 78 95 76 97 +2 FEF 25-75% (L/sec) 3.09 2.76 111 3.58 129 +16 FEF Max (L/sec) 10.22 9.01 113 9.25 102 -9 FIVC (L) 5.35 5.21 -2 FIF Max (L/sec) 6.42 5.08 -20 ---- LUNG VOLUMES ---- SVC (L) 5.35 4.84 110 IC (L) 3.96 3.47 114 RV (Pleth) (L) 3.66 2.52 145 TLC (Pleth) (L) 9.01 7.42 121 RV/TLC (Pleth) (%) 41 35 115 ERV (L) 1.39 1.37 101 ---- DIFFUSION ---- DLCOunc (ml/min/mmHg) 29.42 27.90 105 DLCOcor (ml/min/mmHg) 29.34 27.90 105 DL/VA (ml/min/mmHg/L) 3.63 4.05 89 VA (L) 8.09 6.89 117 Hgb (gm/dL) 12-18 14.7 ---- AIRWAYS RESISTANCE -- Raw (cmH2O/L/s) 0.65 1.45 45 Gaw (L/s/cmH2O) 1.81 1.03 175 sRaw (cmH2O*s) 3.46 < 4.76 sGaw (1/cmH2O*s) 0.35 0.20 174    Labs 12/09/2019 IgE>> 11 TSH 3.16 D-dimer 0.22 Eosinophils  1.1 BNP 15  Echo 07/18/2015>> EF 35-40% Left ventricle: The cavity size was normal. Systolic function was  moderately reduced. The estimated ejection fraction was in the  range of 35% to  40%. Diffuse hypokinesis. Doppler parameters are  consistent with abnormal left ventricular relaxation (grade 1  diastolic dysfunction).  - Mitral valve: Calcified annulus. There was moderate  regurgitation.  - Left atrium: The atrium was mildly dilated.  - Right ventricle: Systolic function was mildly reduced.  - Right atrium: The atrium was mildly dilated.  - Pulmonary arteries: Systolic pressure was mildly increased. PA  peak pressure: 38 mm Hg (S).   CTA Chest 07/17/2015 No significant lymphadenopathy in the chest. Emphysematous changes and slight fibrosis in the lungs. No focal airspace disease or consolidation. Subpleural nodule in the left lung base measuring 6 mm is unchanged since previous study. Nodule in the right costophrenic angle measuring 5.6 is also unchanged. No pleural effusions. No pneumothorax  Assessment and Plan: Cough and Dyspnea No ILD per HRCT + BD in midflows on PFT No significant  Obstruction DLCO 105% predicted Plan We will do a therapeutic trial with either Symbicort or Dulera ( Whatever we have samples of) to see if you find this helpful in regard to your dyspnea, chest tightness, raspiness. Take this medication every day without fail. We will prescribe a rescue inhaler for use as needed .  Take this as needed  for breakthrough shortness of breath or wheezing. Continue Antihistamine as allergies can be the trigger of bronchospasm Echo as scheduled Follow up with Judson Roch NP in 4 weeks after trial with Maintenance  inhaler and rescue inhaler to see if any improvement , and to review echo results.     Follow Up Instructions: Follow up with Judson Roch NP  in 4 weeks after trial with Maintenance  inhaler and rescue inhaler to see if any improvement , and to review echo results.      I discussed the assessment and treatment plan with the patient. The patient was provided an opportunity to ask questions and all were answered. The patient agreed with  the plan and demonstrated an understanding of the instructions.   The patient was advised to call back or seek an in-person evaluation if the symptoms worsen or if the condition fails to improve as anticipated.  I provided 35 minutes of non-face-to-face time during this encounter.   Magdalen Spatz, NP 01/28/2020 3:04 PM

## 2020-01-28 NOTE — Patient Instructions (Addendum)
I'm glad you are feeling better. We will do a therapeutic trial with either Symbicort or Dulera ( Whatever we have samples of) to see if you find this helpful in regard to your dyspnea, chest tightness, raspiness. Take this medication every day without fail. We will prescribe a rescue inhaler for use as needed .  Take this as needed  for breakthrough shortness of breath or wheezing. Continue Antihistamine as allergies can be the trigger of bronchospasm Echo as scheduled Follow up with Judson Roch NP or Dr. Melvyn Novas in 4 weeks after trial with Maintenance  inhaler and rescue inhaler to see if any improvement , and to review echo results.

## 2020-01-29 ENCOUNTER — Other Ambulatory Visit: Payer: Self-pay | Admitting: Acute Care

## 2020-01-29 DIAGNOSIS — J45909 Unspecified asthma, uncomplicated: Secondary | ICD-10-CM

## 2020-01-29 MED ORDER — DULERA 100-5 MCG/ACT IN AERO: 2.0000 | INHALATION_SPRAY | Freq: Two times a day (BID) | RESPIRATORY_TRACT | 2 refills | Status: DC

## 2020-02-03 ENCOUNTER — Other Ambulatory Visit: Payer: Self-pay

## 2020-02-03 ENCOUNTER — Ambulatory Visit (HOSPITAL_COMMUNITY): Payer: 59 | Attending: Internal Medicine

## 2020-02-03 DIAGNOSIS — R0602 Shortness of breath: Secondary | ICD-10-CM | POA: Insufficient documentation

## 2020-02-03 DIAGNOSIS — J849 Interstitial pulmonary disease, unspecified: Secondary | ICD-10-CM | POA: Insufficient documentation

## 2020-02-05 NOTE — Progress Notes (Signed)
Please call patient and let him know his 2D Echo of the heart is normal. Thanks so much

## 2020-02-06 ENCOUNTER — Telehealth: Payer: Self-pay

## 2020-02-06 NOTE — Telephone Encounter (Signed)
LMTCB x1 for pt.  

## 2020-02-06 NOTE — Telephone Encounter (Signed)
PA request reeived for Dulera 100. Per 4/8 OV note with Judson Roch, it looks like pt was to start either Symbicort or Dulera based on what samples we had available to provide patient.   Symbicort is preferred by pt's insurance.  SG please advise if ok to switch to Symbicort, and also please advise if you'd like pt to be switched to Symbicort 160 or 80.  Thanks!

## 2020-02-06 NOTE — Telephone Encounter (Signed)
Please call patient . I gave him a coupon for 15 dollar Dulera through his commercial insurance.. See if he was able to secure [facility] with this coupon. If not, then try Symbicort 80. Thanks

## 2020-03-04 ENCOUNTER — Telehealth: Payer: Self-pay

## 2020-03-04 NOTE — Telephone Encounter (Signed)
I called pt, to check if he was using his inhalers. He stated that he couldn't get the [facility] because it was 1400 dollars. He tried a coupon but the pharmacy couldn't accept it. Can the pharmacy team run his insurance to see which inhalers are covered?

## 2020-03-04 NOTE — Telephone Encounter (Signed)
Rachael is not in office to look up benefits investigation.  It sounds like patient has a deductible to meet so all inhalers would be expensive.  He has [employer] and would not be eligible to use co-pay card.  Dulera does not have patient assistance.  My recommendation would be to apply for Symbicort patient assistance through [employer].  Patient would qualify Maximum yearly gross 1<$35,000, 2< $48,000, 3< $60,000, 4< $70,000.  Would need to coordinate PAP through Dr. Melvyn Novas and nurse if that is the direction he wants to proceed. Thanks!   Mariella Saa, PharmD, [place], [place] [facility] Pharmacist [phone]  03/04/2020 3:28 PM

## 2020-03-04 NOTE — Telephone Encounter (Signed)
It is very unlikely that either inhaler will help based on last pft so I rec proceeding to CPST which I recommended back in February if not doing better   Other option is f/u ov with me to regroup and go over the w/u to date in more detail with pt but prefer to do CPST first so we know what we're attempting to treat

## 2020-03-04 NOTE — Telephone Encounter (Signed)
MW please advise.  Thanks.  

## 2020-03-12 NOTE — Telephone Encounter (Signed)
Spoke with pt, advised message from Dr. Melvyn Novas. Pt states he will call us back after he comes back into town from vacation. Will close encounter.

## 2020-03-15 NOTE — Telephone Encounter (Signed)
Contacted patient, made follow up appointment regarding medication management. Patient unable to afford [facility]. Notes from Dr. Melvyn Novas is uncertain if Symbicort will be helpful, considering a CPST.

## 2020-04-14 ENCOUNTER — Ambulatory Visit (INDEPENDENT_AMBULATORY_CARE_PROVIDER_SITE_OTHER): Payer: 59 | Admitting: Internal Medicine

## 2020-04-14 ENCOUNTER — Encounter: Payer: Self-pay | Admitting: Internal Medicine

## 2020-04-14 ENCOUNTER — Other Ambulatory Visit: Payer: Self-pay

## 2020-04-14 DIAGNOSIS — R05 Cough: Secondary | ICD-10-CM | POA: Diagnosis not present

## 2020-04-14 DIAGNOSIS — R0600 Dyspnea, unspecified: Secondary | ICD-10-CM | POA: Diagnosis not present

## 2020-04-14 DIAGNOSIS — R0609 Other forms of dyspnea: Secondary | ICD-10-CM

## 2020-04-14 DIAGNOSIS — R059 Cough, unspecified: Secondary | ICD-10-CM

## 2020-04-14 NOTE — Progress Notes (Signed)
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 moved from  [place]  to [facility] in 2008 then developed intermittent rhinitis year round pattern  never eval by allergist and better p clariton but persistent daily pnds did not resolve on clariton - developed short of breath / chest congestion > [facility]/ GI dx reflux > complete resolution on omeprazole once a day and fine with good ex tol including yard work abruptly more sob p yard work early Feb 2021 assoc "constant"  chest tightness and  New hoarseness  with longstanding pnds which is not different from baseline nor any assoc cough or nasal congestions vs baseline referred to pulmonary clinic 12/09/2019 by Leonard Noble  p rx doxy > no better     History of Present Illness  12/09/2019  Pulmonary/ 1st office eval/Leonard Noble  Chief Complaint  Patient presents with  . Pulmonary Consult    Referred by Leonard Noble for increased SOB over the past 2 wks. He states he gets winded walking up stairs. He feels a pressure in his chest and states occ feels lightheaded.   Dyspnea:  Not as active / gave up yard work  Cough: clearing thorat Sleep: fine 30 degrees but snores  SABA use: none Symptoms present while awake = tight chest, light headed no worse with exercise. One brother 48 MI / smoker  rec Increase omeprazole to Take 30- 60 min before your first and last meals of the day  Zyrtec 10 mg at bedtime (over the counter) GERD diet     Leonard Noble recs  12/29/19 Please schedule you Covid vaccine.  We will schedule a 2 D Echo to evaluate your Pulmonary Artery Pressures.  We will order a pulmonary function test.  You will get Covid Testing prior.  We will schedule a HRCT of your chest  to evaluate for any fibrotic changes. Continue your Omeprazole as Leonard. Melvyn Noble prescribed  Continue Zyrtec as you have been doing.   Leonard Noble recs  02/17/20  therapeutic trial with either Symbicort or Dulera ( Whatever we have  samples of) to see if you find this helpful in regard to your dyspnea, chest tightness, raspiness. Take this medication every day without fail. We will prescribe a rescue inhaler for use as needed .  Take this as needed  for breakthrough shortness of breath or wheezing. Continue Antihistamine as allergies can be the trigger of bronchospasm Echo as scheduled > nl     04/14/2020  f/u ov/Leonard Noble re: cough better to his satisfaction / second   pfizer may  Chief Complaint  Patient presents with  . Follow-up   Dyspnea:  Some hills o/w ok  Cough: minimal mucoid  Sleeping: one pillow flat bed ever since neck surgery 2010  SABA use: inhalers 02: none    No obvious day to day or daytime variability or assoc excess/ purulent sputum or mucus plugs or hemoptysis or cp or chest tightness, subjective wheeze or overt sinus or hb symptoms.   Sleeping as above without nocturnal  or early am exacerbation  of respiratory  c/o's or need for noct saba. Also denies any obvious fluctuation of symptoms with weather or environmental changes or other aggravating or alleviating factors except as outlined above   No unusual exposure hx or h/o childhood pna/ asthma or knowledge of premature birth.  Current Allergies, Complete Past Medical History, Past Surgical History, Family History, and Social  History were reviewed in [facility] record.  ROS  The following are not active complaints unless bolded Hoarseness, sore throat, dysphagia, dental problems, itching, sneezing,  nasal congestion or discharge of excess mucus or purulent secretions, ear ache,   fever, chills, sweats, unintended wt loss or wt gain, classically pleuritic or exertional cp,  orthopnea pnd or arm/hand swelling  or leg swelling, presyncope, palpitations, abdominal pain, anorexia, nausea, vomiting, diarrhea  or change in bowel habits or change in bladder habits, change in stools or change in urine, dysuria, hematuria,  rash,  arthralgias, visual complaints, headache, numbness, weakness or ataxia or problems with walking or coordination,  change in mood or  memory.        Current Meds  Medication Sig  . aspirin 81 MG tablet Take 81 mg by mouth daily.  . Calcium Carbonate (CALCIUM 600 PO) Take 1 capsule by mouth daily.  . Cholecalciferol (VITAMIN D-3) 5000 units TABS Take by mouth.  . Coenzyme Q10 (CO Q-10) 100 MG CAPS Take 100 mg by mouth daily.   Marland Kitchen ezetimibe (ZETIA) 10 MG tablet Take 10 mg by mouth daily.  . Glucosamine-Chondroit-Vit C-Mn (GLUCOSAMINE 1500 COMPLEX PO) Take 1 capsule by mouth daily.  . lansoprazole (PREVACID) 30 MG capsule Take 30 mg by mouth 2 (two) times daily before a meal.   . Multiple Vitamin (MULTIVITAMIN) tablet Take 1 tablet by mouth daily.  . Multiple Vitamins-Minerals (ICAPS AREDS FORMULA PO) Take 1 tablet by mouth daily.  . Multiple Vitamins-Minerals (PRESERVISION AREDS PO) Take by mouth.  . polyethylene glycol powder (GLYCOLAX/MIRALAX) powder Take 17 g by mouth daily as needed for mild constipation.   . rosuvastatin (CRESTOR) 5 MG tablet Take 5 mg by mouth every other day.  . vitamin B-12 (CYANOCOBALAMIN) 500 MCG tablet Take 500 mcg by mouth daily.  . vitamin E (VITAMIN E) 180 MG (400 UNITS) capsule Take 400 Units by mouth daily.            Past Medical History:  Diagnosis Date  . 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 GERD (gastroesophageal reflux disease)   . H/O asbestos exposure   . H/O prostatitis   . IBS (irritable bowel syndrome)   . Pericarditis   . Pulmonary nodule   . SOB (shortness of breath) on exertion        Objective:    04/14/2020        182 12/29/19 182 lb 9.6 oz (82.8 kg)  12/09/19 181 lb (82.1 kg)  05/03/18 172 lb (78 kg)    slt hoarse amb wm nad   Vital signs reviewed - Note on arrival 02 sats  99% on RA   HEENT : pt wearing mask not removed for exam due to covid -19 concerns.    NECK :  without JVD/Nodes/TM/ nl carotid upstrokes  bilaterally   LUNGS: no acc muscle use,  Nl contour chest which is clear to A and P bilaterally without cough on insp or exp maneuvers   CV:  RRR  no s3 or murmur or increase in P2, and no edema   ABD:  soft and nontender with nl inspiratory excursion in the supine position. No bruits or organomegaly appreciated, bowel sounds nl  MS:  Nl gait/ ext warm without deformities, calf tenderness, cyanosis or clubbing No obvious joint restrictions   SKIN: warm and dry without lesions    NEURO:  alert, approp, nl sensorium with  no motor or cerebellar deficits apparent.  Assessment

## 2020-04-14 NOTE — Patient Instructions (Signed)
All refills on reflux meds per PCP or GI   See Wilburn Cornelia for nasal or throat symtpoms    If you are satisfied with your treatment plan,  let your doctor know and he/she can either refill your medications or you can return here when your prescription runs out.     If in any way you are not 100% satisfied,  please tell us.  If 100% better, tell your friends!  Pulmonary follow up is as needed

## 2020-04-15 ENCOUNTER — Encounter: Payer: Self-pay | Admitting: Internal Medicine

## 2020-04-15 NOTE — Assessment & Plan Note (Signed)
Onset early Feb 2021 assoc with hoarsness/ palpitations - PFTs 06/03/09 supra nl FEV1 with increased lung volumes - 12/09/2019   Walked RA x two laps =  approx 535ft @ very fast pace - stopped due to end of study with sats of 96 % at the end of the study then became more sob when sat down > ekg sr with one pvc, no ischemic change  -HRCt 4//6/21 1. No evidence of interstitial lung disease. 2. Scattered small parenchymal bands at the dependent lung bases bilaterally, compatible with nonspecific mild postinfectious/postinflammatory scarring. 3. Solitary 5 mm solid right lower lobe pulmonary nodule. No follow-up needed if patient is low-risk (never smoker/ no risk) - Echo 02/03/20 wnl     Improved to his satisfaction > next step is cpst if warranted but not needed for now          Each maintenance medication was reviewed in detail including emphasizing most importantly the difference between maintenance and prns and under what circumstances the prns are to be triggered using an action plan format where appropriate.  Total time for H and P, chart review, counseling,   and generating customized AVS unique to this office visit / charting = 20 min

## 2020-04-15 NOTE — Assessment & Plan Note (Addendum)
Allergy profile  12/09/19 >  Eos 0.1 /  IgE  11  Typical of UACS  / irritable larynx syndrome > f/u with Dr Wilburn Cornelia

## 2020-08-21 IMAGING — CT CT CHEST HIGH RESOLUTION W/O CM
1 of 3 series · 14 of 32 positions shown, 18 images · non-contrast
Comparison: 12/09/2019 chest radiograph. 07/17/2015 CT angiogram of
the chest.

CLINICAL DATA: Persistent cough for 2 months. Nonsmoker.

EXAM:
CT CHEST WITHOUT CONTRAST
TECHNIQUE: Multidetector CT imaging of the chest was performed following the
standard protocol without intravenous contrast. High resolution
imaging of the lungs, as well as inspiratory and expiratory imaging,
was performed.

[Series 2: chest · axial · 0.74mm/px · z∈[+860,+1154]mm · 14 of 163 slices shown, 18 images]
[im 8/163  mediastinal]
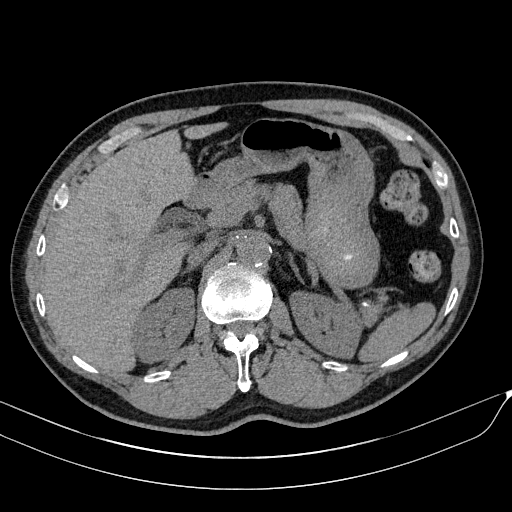
[im 8/163  lung]
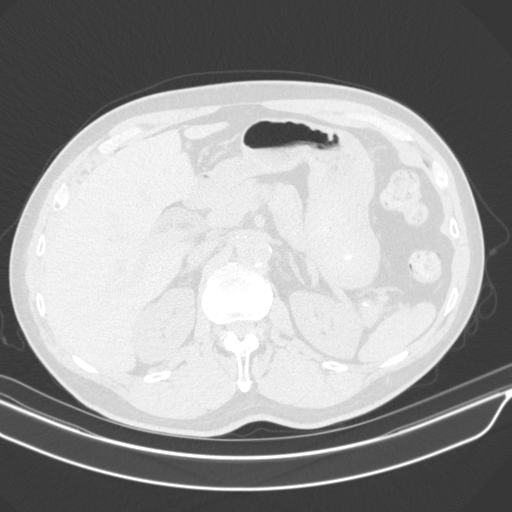
[im 23/163  lung]
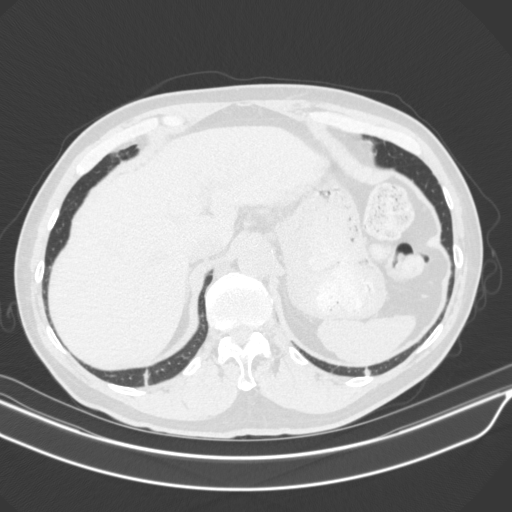
[im 37/163  lung]
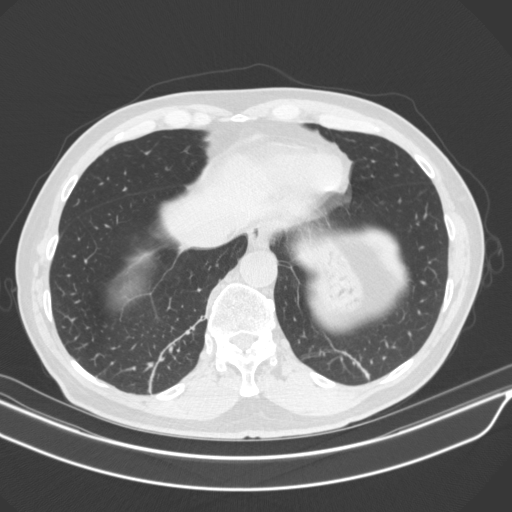
[im 52/163  lung]
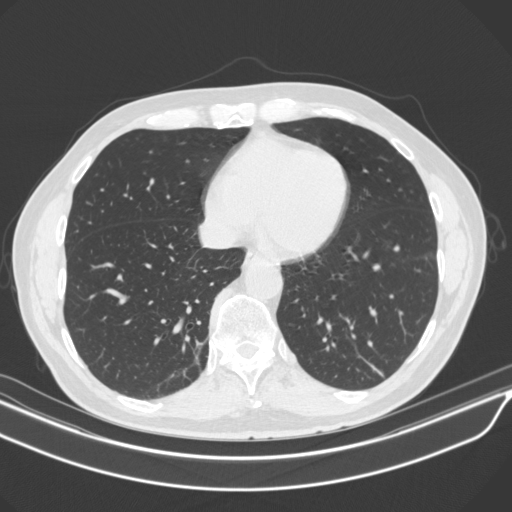
[im 55/163  mediastinal]
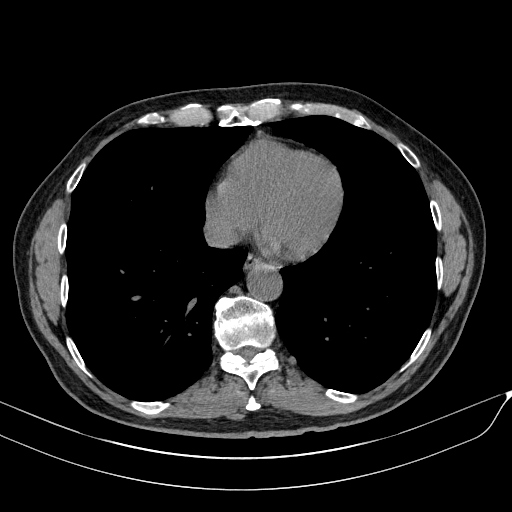
[im 55/163  lung]
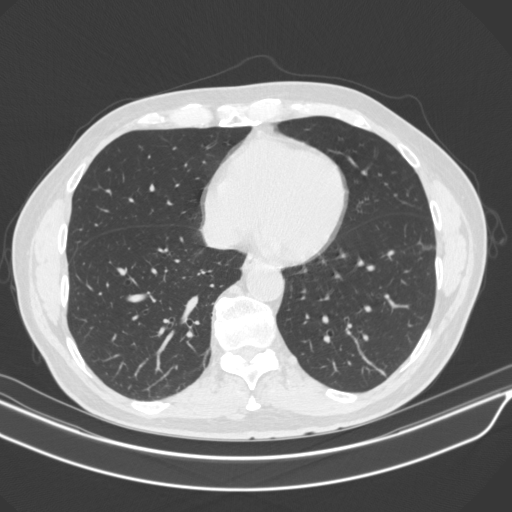
[im 67/163  lung]
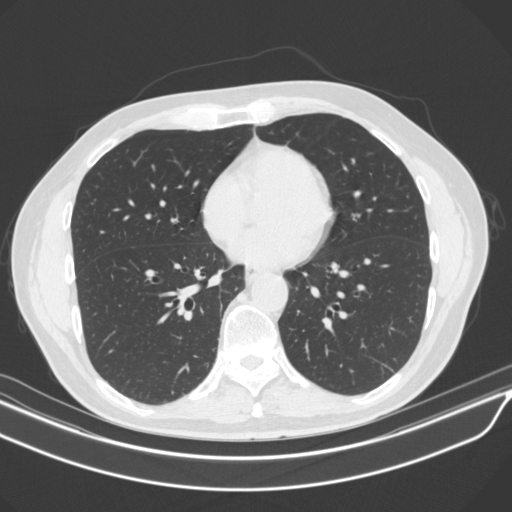
[im 78/163  lung]
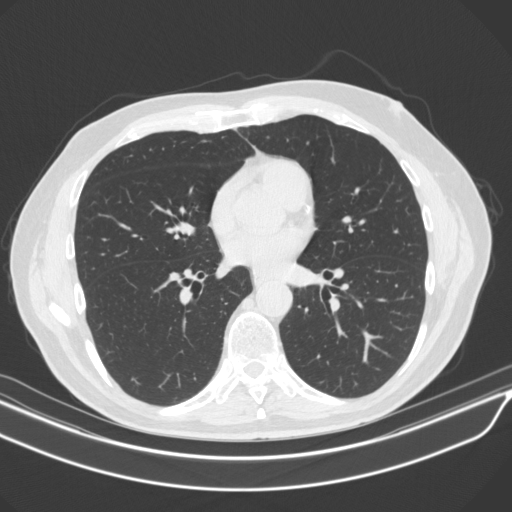
[im 82/163  lung]
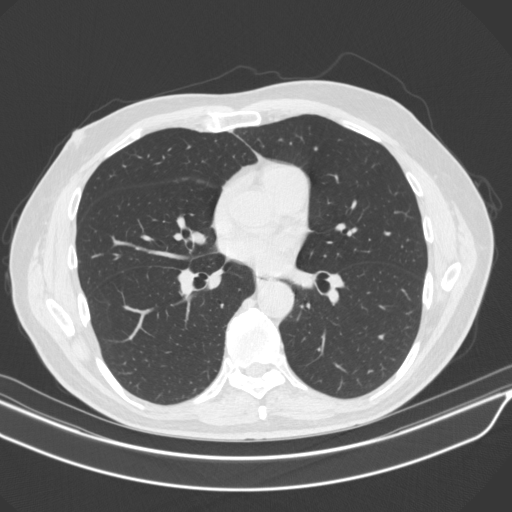
[im 96/163  mediastinal]
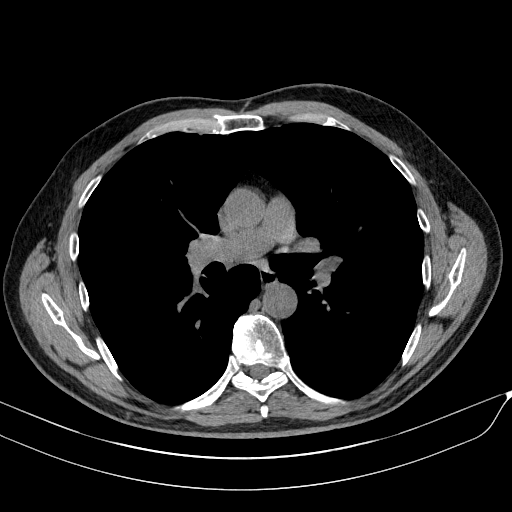
[im 96/163  lung]
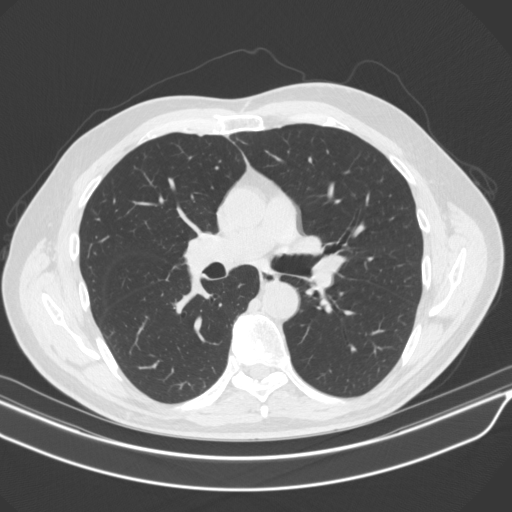
[im 109/163  lung]
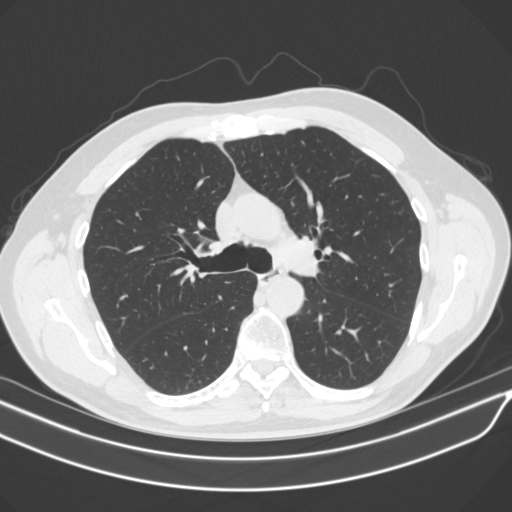
[im 118/163  lung]
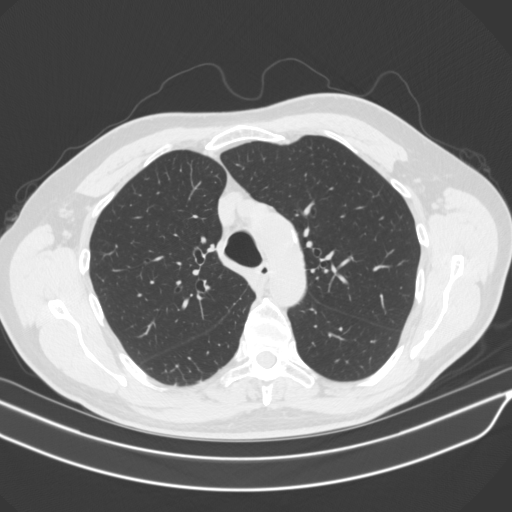
[im 126/163  lung]
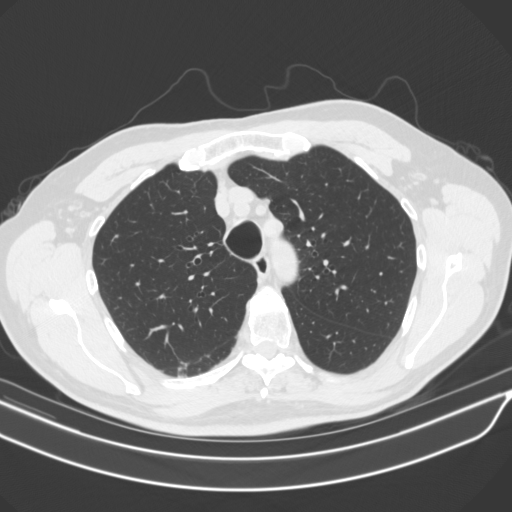
[im 140/163  mediastinal]
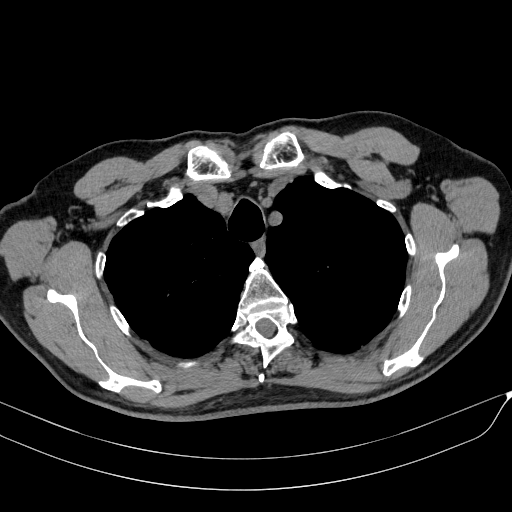
[im 140/163  lung]
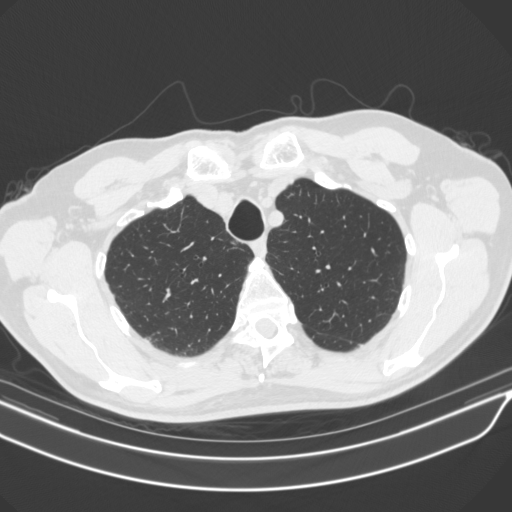
[im 155/163  lung]
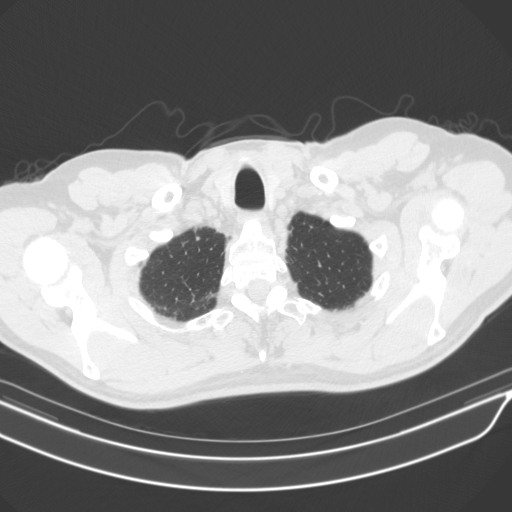

[14 of 32 positions shown; findings below may reference images not displayed]

FINDINGS: Cardiovascular: Normal heart size. No significant pericardial
effusion/thickening. Left anterior descending coronary
atherosclerosis. Atherosclerotic nonaneurysmal thoracic aorta.
Normal caliber pulmonary arteries.

Mediastinum/Nodes: No discrete thyroid nodules. Unremarkable
esophagus. No pathologically enlarged axillary, mediastinal or hilar
lymph nodes, noting limited sensitivity for the detection of hilar
adenopathy on this noncontrast study.

Lungs/Pleura: No pneumothorax. No pleural effusion. Basilar right
lower lobe 5 mm solid pulmonary nodule (series 5/image 137). No
acute consolidative airspace disease, lung masses or additional
significant pulmonary nodules. A few scattered small parenchymal
bands at the dependent lung bases bilaterally. No significant air
trapping or evidence of tracheobronchomalacia on the expiration
sequence. No significant regions subpleural reticulation,
ground-glass attenuation, traction bronchiectasis, architectural
distortion or frank honeycombing. Mild biapical pleural-parenchymal
scarring.

Upper abdomen: No acute abnormality.

Musculoskeletal: No aggressive appearing focal osseous lesions.
Moderate thoracic spondylosis. Partially visualized surgical
hardware from [facility].
IMPRESSION: 1. No evidence of interstitial lung disease.
2. Scattered small parenchymal bands at the dependent lung bases
bilaterally, compatible with nonspecific mild
postinfectious/postinflammatory scarring.
3. Solitary 5 mm solid right lower lobe pulmonary nodule. No
follow-up needed if patient is low-risk. Non-contrast chest CT can
be considered in 12 months if patient is high-risk. This
recommendation follows the consensus statement: Guidelines for
Management of Incidental Pulmonary Nodules Detected on CT Images:
4. One vessel coronary atherosclerosis.
5. Aortic Atherosclerosis (A1E2D-QXE.E).

## 2021-09-26 ENCOUNTER — Other Ambulatory Visit: Payer: Self-pay | Admitting: Physician Assistant

## 2021-09-26 DIAGNOSIS — R1011 Right upper quadrant pain: Secondary | ICD-10-CM

## 2021-10-07 ENCOUNTER — Ambulatory Visit
Admission: RE | Admit: 2021-10-07 | Discharge: 2021-10-07 | Disposition: A | Discharge status: Home / Self Care | Payer: Medicare Other | Source: Ambulatory Visit | Attending: Physician Assistant | Admitting: Physician Assistant

## 2021-10-07 DIAGNOSIS — R1011 Right upper quadrant pain: Secondary | ICD-10-CM

## 2022-07-13 ENCOUNTER — Other Ambulatory Visit: Payer: Self-pay | Admitting: *Deleted

## 2022-07-13 DIAGNOSIS — K551 Chronic vascular disorders of intestine: Secondary | ICD-10-CM

## 2022-07-20 NOTE — Progress Notes (Signed)
[facility] HISTORY AND PHYSICAL   History of Present Illness:  Patient is a 71 y.o. year old male who presents for evaluation of Asymptomatic stable small celiac aneurysm and two asymptomatic small splenic artery aneurysms.    Previous f/u demonstrated  1.49 cm x 1.7 cm (compared to 1.4 cm x 1.3 cm on 03-17-16).   He denies abdominal pain, food fear or post prandial pain. He is medically managed on ASA and Statin daily.  He stays active outside during yard and garden work.  He is not a smoker.       Past Medical History:  Diagnosis Date   Celiac artery aneurysm ([facility])    GERD (gastroesophageal reflux disease)    H/O asbestos exposure    H/O prostatitis    IBS (irritable bowel syndrome)    Pericarditis    Pulmonary nodule    SOB (shortness of breath) on exertion     Past Surgical History:  Procedure Laterality Date   CARDIAC CATHETERIZATION N/A 07/17/2015   Procedure: Left Heart Cath and Coronary Angiography;  Surgeon: Jettie Booze, MD;  Location: [facility];  Service: Cardiovascular;  Laterality: N/A;   CERVICAL SPINE SURGERY  2012   C-Spine - Neck   EYE SURGERY Left Feb. 2014   Muscle correction   HERNIA REPAIR  1994   right    SPINE SURGERY      ROS:   General:  No weight loss, Fever, chills  HEENT: No recent headaches, no nasal bleeding, no visual changes, no sore throat  Neurologic: No dizziness, blackouts, seizures. No recent symptoms of stroke or mini- stroke. No recent episodes of slurred speech, or temporary blindness.  Cardiac: No recent episodes of chest pain/pressure, no shortness of breath at rest.  No shortness of breath with exertion.  Denies history of atrial fibrillation or irregular heartbeat  Vascular: No history of rest pain in feet.  No history of claudication.  No history of non-healing ulcer, No history of DVT   Pulmonary: No home oxygen, no productive cough, no hemoptysis,  No asthma or  wheezing  Musculoskeletal:  '[ ]'$  Arthritis, '[ ]'$  Low back pain,  '[ ]'$  Joint pain  Hematologic:No history of hypercoagulable state.  No history of easy bleeding.  No history of anemia  Gastrointestinal: No hematochezia or melena,  No gastroesophageal reflux, no trouble swallowing  Urinary: '[ ]'$  chronic Kidney disease, '[ ]'$  on HD - '[ ]'$  MWF or '[ ]'$  TTHS, '[ ]'$  Burning with urination, '[ ]'$  Frequent urination, '[ ]'$  Difficulty urinating;   Skin: No rashes  Psychological: No history of anxiety,  No history of depression  Social History Social History   Tobacco Use   Smoking status: Never   Smokeless tobacco: Never  Vaping Use   Vaping Use: Never used  Substance Use Topics   Alcohol use: Yes    Alcohol/week: 2.0 standard drinks of alcohol    Types: 2 Glasses of wine per week   Drug use: No    Family History Family History  Problem Relation Age of Onset   Cancer Mother        Multiple Myaloma   Diabetes Father    Heart attack Brother        Blood Leg- Leg   Heart disease Brother    Hypertension Brother     Allergies  Allergies  Allergen Reactions   Epinephrine     REACTION: PVC's   Bactrim [Sulfamethoxazole-Trimethoprim]  Nauseous, shortness of breath     Current Outpatient Medications  Medication Sig Dispense Refill   aspirin 81 MG tablet Take 81 mg by mouth daily.     Calcium Carbonate (CALCIUM 600 PO) Take 1 capsule by mouth daily.     Cholecalciferol (VITAMIN D-3) 5000 units TABS Take by mouth.     Coenzyme Q10 (CO Q-10) 100 MG CAPS Take 100 mg by mouth daily.      ezetimibe (ZETIA) 10 MG tablet Take 10 mg by mouth daily.     Glucosamine-Chondroit-Vit C-Mn (GLUCOSAMINE 1500 COMPLEX PO) Take 1 capsule by mouth daily.     lansoprazole (PREVACID) 30 MG capsule Take 30 mg by mouth 2 (two) times daily before a meal.      mometasone-formoterol (DULERA) 100-5 MCG/ACT AERO Inhale 2 puffs into the lungs 2 (two) times daily. (Patient not taking: Reported on 04/14/2020) 13 g 2    Multiple Vitamin (MULTIVITAMIN) tablet Take 1 tablet by mouth daily.     Multiple Vitamins-Minerals (ICAPS AREDS FORMULA PO) Take 1 tablet by mouth daily.     Multiple Vitamins-Minerals (PRESERVISION AREDS PO) Take by mouth.     polyethylene glycol powder (GLYCOLAX/MIRALAX) powder Take 17 g by mouth daily as needed for mild constipation.   0   rosuvastatin (CRESTOR) 5 MG tablet Take 5 mg by mouth every other day.     vitamin B-12 (CYANOCOBALAMIN) 500 MCG tablet Take 500 mcg by mouth daily.     vitamin E (VITAMIN E) 180 MG (400 UNITS) capsule Take 400 Units by mouth daily.     No current facility-administered medications for this visit.    Physical Examination  Vitals:   07/21/22 0826  BP: 128/72  Pulse: 60  Resp: 16  Temp: 97.9 F (36.6 C)  TempSrc: Temporal  SpO2: 98%  Weight: 179 lb (81.2 kg)  Height: '6\' 1"'$  (1.854 m)    Body mass index is 23.62 kg/m.  General:  Alert and oriented, no acute distress HEENT: Normal Neck: No bruit or JVD Pulmonary: Clear to auscultation bilaterally Cardiac: Regular Rate and Rhythm without murmur Abdomen: Soft, non-tender, non-distended, no mass, no scars Skin: No rash Extremity Pulses:  2+ radial, brachial, femoral, dorsalis pedis, posterior tibial pulses bilaterally Musculoskeletal: No deformity or edema  Neurologic: Upper and lower extremity motor 5/5 and symmetric  DATA:   Duplex Findings:  +----------------------+--------+--------+------+-----------------------+  Mesenteric            PSV cm/sEDV cm/sPlaque       Comments          +----------------------+--------+--------+------+-----------------------+  Celiac Artery Origin    442                                          +----------------------+--------+--------+------+-----------------------+  Celiac Artery Proximal                      aneurysm 1.41 x 1.58 cm  +----------------------+--------+--------+------+-----------------------+  SMA Origin               185      22                                  +----------------------+--------+--------+------+-----------------------+  Splenic                 127  13           mid 0.45 x 0.47 cm     +----------------------+--------+--------+------+-----------------------+      Summary:  Mesenteric:     Celiac artery aneurysm measures 1.41 x 1.58 cm.  Mid splenic artery aneurys measures 0.45 x 0.47 cm. Distal splenic artery  aneurysm not visualized.  Celiac artery stenosis >70%.            ASSESSMENT:  71 y/o male with history of celiac and splenic aneurysm.  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   He remains asymptomatic for food fear, post prandial pain or N/D.    The duplex shows slight increase in the Celiac artery aneurysm measures 1.41 x 1.58 cm.  The aneurysm may need intervention if he develops symptoms or it's size is > 2.0 cm in diameter.      PLAN: Repeat mesenteric duplex in one year.   If he develops symptoms of abdominal pain, nausea, vomiting, appetite loss, or symptoms of mesenteric ischemia he will call.  Roxy Horseman PA-C [facility] Office: [phone]

## 2022-07-21 ENCOUNTER — Ambulatory Visit (HOSPITAL_COMMUNITY)
Admission: RE | Admit: 2022-07-21 | Discharge: 2022-07-21 | Disposition: A | Discharge status: Home / Self Care | Payer: Medicare Other | Source: Ambulatory Visit | Attending: Vascular Surgery | Admitting: Vascular Surgery

## 2022-07-21 ENCOUNTER — Ambulatory Visit (INDEPENDENT_AMBULATORY_CARE_PROVIDER_SITE_OTHER): Payer: Medicare Other | Admitting: Physician Assistant

## 2022-07-21 VITALS — BP 128/72 | HR 60 | Temp 97.9°F | Resp 16 | Ht 73.0 in | Wt 179.0 lb

## 2022-07-21 DIAGNOSIS — K551 Chronic vascular disorders of intestine: Secondary | ICD-10-CM | POA: Diagnosis present

## 2022-10-19 ENCOUNTER — Emergency Department (HOSPITAL_COMMUNITY): Payer: Medicare Other

## 2022-10-19 ENCOUNTER — Other Ambulatory Visit: Payer: Self-pay

## 2022-10-19 ENCOUNTER — Emergency Department (HOSPITAL_COMMUNITY)
Admission: EM | Admit: 2022-10-19 | Discharge: 2022-10-19 | Disposition: A | Discharge status: Home / Self Care | Payer: Medicare Other | Attending: Emergency Medicine | Admitting: Emergency Medicine

## 2022-10-19 DIAGNOSIS — Z7982 Long term (current) use of aspirin: Secondary | ICD-10-CM | POA: Diagnosis not present

## 2022-10-19 DIAGNOSIS — Z8616 Personal history of COVID-19: Secondary | ICD-10-CM | POA: Insufficient documentation

## 2022-10-19 DIAGNOSIS — K610 Anal abscess: Secondary | ICD-10-CM | POA: Insufficient documentation

## 2022-10-19 LAB — CBC WITH DIFFERENTIAL/PLATELET
Abs Immature Granulocytes: 0.03 10*3/uL (ref 0.00–0.07)
Basophils Absolute: 0 10*3/uL (ref 0.0–0.1)
Basophils Relative: 0 %
Eosinophils Absolute: 0 10*3/uL (ref 0.0–0.5)
Eosinophils Relative: 1 %
HCT: 40.1 % (ref 39.0–52.0)
Hemoglobin: 13.1 g/dL (ref 13.0–17.0)
Immature Granulocytes: 0 %
Lymphocytes Relative: 13 %
Lymphs Abs: 1 10*3/uL (ref 0.7–4.0)
MCH: 31.3 pg (ref 26.0–34.0)
MCHC: 32.7 g/dL (ref 30.0–36.0)
MCV: 95.9 fL (ref 80.0–100.0)
Monocytes Absolute: 0.9 10*3/uL (ref 0.1–1.0)
Monocytes Relative: 12 %
Neutro Abs: 5.6 10*3/uL (ref 1.7–7.7)
Neutrophils Relative %: 74 %
Platelets: 256 10*3/uL (ref 150–400)
RBC: 4.18 MIL/uL — ABNORMAL LOW (ref 4.22–5.81)
RDW: 12.9 % (ref 11.5–15.5)
WBC: 7.6 10*3/uL (ref 4.0–10.5)
nRBC: 0 % (ref 0.0–0.2)

## 2022-10-19 LAB — BASIC METABOLIC PANEL
Anion gap: 7 (ref 5–15)
BUN: 16 mg/dL (ref 8–23)
CO2: 28 mmol/L (ref 22–32)
Calcium: 8.2 mg/dL — ABNORMAL LOW (ref 8.9–10.3)
Chloride: 101 mmol/L (ref 98–111)
Creatinine, Ser: 0.9 mg/dL (ref 0.61–1.24)
GFR, Estimated: >60 mL/min (ref >60)
Glucose, Bld: 123 mg/dL — ABNORMAL HIGH (ref 70–99)
Potassium: 3.4 mmol/L — ABNORMAL LOW (ref 3.5–5.1)
Sodium: 136 mmol/L (ref 135–145)

## 2022-10-19 MED ORDER — AMOXICILLIN-POT CLAVULANATE 875-125 MG PO TABS
1.0000 | ORAL_TABLET | Freq: Once | ORAL | Status: AC
  Administered 2022-10-19: 1 via ORAL
  Filled 2022-10-19: qty 1

## 2022-10-19 MED ORDER — SODIUM CHLORIDE 0.9 % IV BOLUS
1000.0000 mL | Freq: Once | INTRAVENOUS | Status: AC
  Administered 2022-10-19: 1000 mL via INTRAVENOUS

## 2022-10-19 MED ORDER — AMOXICILLIN-POT CLAVULANATE 875-125 MG PO TABS: 1.0000 | ORAL_TABLET | Freq: Two times a day (BID) | ORAL | 0 refills | Status: AC

## 2022-10-19 MED ORDER — MORPHINE SULFATE (PF) 2 MG/ML IV SOLN
2.0000 mg | Freq: Once | INTRAVENOUS | Status: AC
  Administered 2022-10-19: 2 mg via INTRAVENOUS
  Filled 2022-10-19: qty 1

## 2022-10-19 MED ORDER — ONDANSETRON HCL 4 MG/2ML IJ SOLN
4.0000 mg | Freq: Once | INTRAMUSCULAR | Status: AC
  Administered 2022-10-19: 4 mg via INTRAVENOUS
  Filled 2022-10-19: qty 2

## 2022-10-19 MED ORDER — IOHEXOL 300 MG/ML  SOLN
100.0000 mL | Freq: Once | INTRAMUSCULAR | Status: AC | PRN
  Administered 2022-10-19: 100 mL via INTRAVENOUS

## 2022-10-19 NOTE — ED Triage Notes (Signed)
Pt c/o abscess to upper buttocks x 1 week. Area is red. No hardness. Pt c/o pain to this area.

## 2022-10-19 NOTE — ED Provider Notes (Signed)
[facility] EMERGENCY DEPARTMENT Provider Note   CSN: [id number] Arrival date & time: 10/19/22  1423     History  Chief Complaint  Patient presents with   Abscess    Leonard Noble is a 71 y.o. male.   Abscess Associated symptoms: no fever, no headaches, no nausea and no vomiting        Leonard Noble is a 71 y.o. male with past medical history of prostatitis, GERD, currently COVID-positive who presents to the Emergency Department complaining of rectal pain and pain of his right buttock area x 1 week gradually worsening for several days.  He states that he had a pilonidal cyst some 40 to 50 years ago and current pain feels similar.  He has pain worse with sitting or walking.  Pain with attempting to defecate as well.  He denies any abdominal pain, fever, chills, dysuria or pain of his scrotum.  Home Medications Prior to Admission medications   Medication Sig Start Date End Date Taking? Authorizing Provider  aspirin 81 MG tablet Take 81 mg by mouth daily.    [provider]  Calcium Carbonate (CALCIUM 600 PO) Take 1 capsule by mouth daily.    [provider]  Cholecalciferol (VITAMIN D-3) 5000 units TABS Take by mouth.    [provider]  Coenzyme Q10 (CO Q-10) 100 MG CAPS Take 100 mg by mouth daily.     [provider]  ezetimibe (ZETIA) 10 MG tablet Take 10 mg by mouth daily.    [provider]  Glucosamine-Chondroit-Vit C-Mn (GLUCOSAMINE 1500 COMPLEX PO) Take 1 capsule by mouth daily.    [provider]  lansoprazole (PREVACID) 30 MG capsule Take 30 mg by mouth 2 (two) times daily before a meal.  03/29/13   [provider]  mometasone-formoterol (DULERA) 100-5 MCG/ACT AERO Inhale 2 puffs into the lungs 2 (two) times daily. Patient not taking: Reported on 04/14/2020 01/29/20   Magdalen Spatz, NP  Multiple Vitamin (MULTIVITAMIN) tablet Take 1 tablet by mouth daily.    [provider]  Multiple Vitamins-Minerals (ICAPS  AREDS FORMULA PO) Take 1 tablet by mouth daily.    [provider]  Multiple Vitamins-Minerals (PRESERVISION AREDS PO) Take by mouth.    [provider]  polyethylene glycol powder (GLYCOLAX/MIRALAX) powder Take 17 g by mouth daily as needed for mild constipation.  01/22/15   [provider]  rosuvastatin (CRESTOR) 5 MG tablet Take 5 mg by mouth every other day.    [provider]  terbinafine (LAMISIL) 250 MG tablet Take 250 mg by mouth daily. 07/20/22   [provider]  vitamin B-12 (CYANOCOBALAMIN) 500 MCG tablet Take 500 mcg by mouth daily.    [provider]  vitamin E (VITAMIN E) 180 MG (400 UNITS) capsule Take 400 Units by mouth daily.    [provider]      Allergies    Epinephrine and Bactrim [sulfamethoxazole-trimethoprim]    Review of Systems   Review of Systems  Constitutional:  Negative for chills and fever.  Respiratory:  Negative for cough.   Cardiovascular:  Negative for chest pain.  Gastrointestinal:  Negative for abdominal pain, nausea and vomiting.  Genitourinary:  Negative for decreased urine volume, difficulty urinating, flank pain, penile pain, penile swelling and scrotal swelling.       Rectal pain   Musculoskeletal:  Negative for back pain, neck pain and neck stiffness.  Skin:  Negative for color change and wound.  Neurological:  Negative  for dizziness, weakness, numbness and headaches.    Physical Exam Updated Vital Signs BP 122/65 (BP Location: Left Arm)   Pulse 85   Temp 98.8 F (37.1 C) (Oral)   Resp 19   SpO2 98%  Physical Exam Vitals and nursing note reviewed.  Constitutional:      General: He is not in acute distress.    Appearance: Normal appearance. He is not toxic-appearing.  Cardiovascular:     Rate and Rhythm: Normal rate and regular rhythm.     Pulses: Normal pulses.  Pulmonary:     Effort: Pulmonary effort is normal.  Abdominal:     General: There is no distension.      Palpations: Abdomen is soft.     Tenderness: There is no abdominal tenderness.  Genitourinary:    Rectum: Tenderness present. No mass or anal fissure. Normal anal tone.     Comments: Tenderness to the perirectal area.  No erythema, fluctuance or visible abscess.  I do not appreciate any induration.  Pain with attempted rectal exam.  No external hemorrhoid or fissure seen. Musculoskeletal:        General: Normal range of motion.  Skin:    General: Skin is warm.     Capillary Refill: Capillary refill takes less than 2 seconds.  Neurological:     General: No focal deficit present.     Mental Status: He is alert.     Sensory: No sensory deficit.     Motor: No weakness.     ED Results / Procedures / Treatments   Labs (all labs ordered are listed, but only abnormal results are displayed) Labs Reviewed  CBC WITH DIFFERENTIAL/PLATELET - Abnormal; Notable for the following components:      Result Value   RBC 4.18 (*)    All other components within normal limits  BASIC METABOLIC PANEL - Abnormal; Notable for the following components:   Potassium 3.4 (*)    Glucose, Bld 123 (*)    Calcium 8.2 (*)    All other components within normal limits    EKG None  Radiology CT ABDOMEN PELVIS W CONTRAST  Result Date: 10/19/2022 CLINICAL DATA:  Abdominal pain, acute, nonlocalized. Rectal pain, tenesmus. Buttock abscess EXAM: CT ABDOMEN AND PELVIS WITH CONTRAST TECHNIQUE: Multidetector CT imaging of the abdomen and pelvis was performed using the standard protocol following bolus administration of intravenous contrast. RADIATION DOSE REDUCTION: This exam was performed according to the departmental dose-optimization program which includes automated exposure control, adjustment of the mA and/or kV according to patient size and/or use of iterative reconstruction technique. CONTRAST:  174m OMNIPAQUE IOHEXOL 300 MG/ML  SOLN COMPARISON:  04/20/2017 FINDINGS: Lower chest: No acute abnormality. Hepatobiliary:  No focal liver abnormality is seen. No gallstones, gallbladder wall thickening, or biliary dilatation. Pancreas: Unremarkable Spleen: Unremarkable Adrenals/Urinary Tract: Adrenal glands are unremarkable. Kidneys are normal, without renal calculi, focal lesion, or hydronephrosis. Bladder is unremarkable. Stomach/Bowel: Severe sigmoid diverticulosis without superimposed acute inflammatory change. Stomach, small bowel, and large bowel are otherwise unremarkable. Appendix normal. No free intraperitoneal gas or fluid. Vascular/Lymphatic: Moderate aortoiliac atherosclerotic calcification. No aortic aneurysm. No pathologic adenopathy within the abdomen and pelvis. Reproductive: Moderate prostatic enlargement. Other: Right inguinal hernia repair with mesh. No recurrent abdominal wall hernia identified. There are inflammatory changes surrounding the anal canal best seen on sagittal image # 69/6. There is, additionally, a loculated extraluminal fluid collection measuring 8 x 19 x 27 mm on axial image # 76/2 and sagittal image # 69/6 along  the right posterolateral aspect of the anal canal in keeping with a perianal abscess. The inflammatory changes involving the anus as well as the abscess remain below the level of the levator ani. Musculoskeletal: No acute bone abnormality. Degenerative changes are seen within the lumbar spine. Remote L4 compression deformity noted without associated retropulsion. No lytic or blastic bone lesion. There is asymmetric subcutaneous infiltration within the lateral left gluteal region superficial to the greater trochanter in keeping with asymmetric focal edema in this location, possibly posttraumatic or inflammatory in nature. No loculated fluid collection, however, is identified. IMPRESSION: 1. Perianal abscess measuring up to 27 mm in greatest dimension. Inflammatory changes involving the anus as well as the abscess remain below the level of the levator ani. 2. Severe sigmoid diverticulosis  without superimposed acute inflammatory change. 3. Moderate prostatic enlargement. 4. Asymmetric subcutaneous infiltration within the lateral left gluteal region superficial to the greater trochanter, possibly posttraumatic or inflammatory in nature. No loculated fluid collection, however, is identified. 5. Right inguinal hernia repair with mesh. No recurrent abdominal wall hernia identified. 6.  Aortic Atherosclerosis (ICD10-I70.0). Electronically Signed   By: Fidela Salisbury M.D.   On: 10/19/2022 20:32    Procedures Procedures    Medications Ordered in ED Medications  sodium chloride 0.9 % bolus 1,000 mL (1,000 mLs Intravenous New Bag/Given 10/19/22 2021)  ondansetron (ZOFRAN) injection 4 mg (4 mg Intravenous Given 10/19/22 2022)  morphine (PF) 2 MG/ML injection 2 mg (2 mg Intravenous Given 10/19/22 2022)  iohexol (OMNIPAQUE) 300 MG/ML solution 100 mL (100 mLs Intravenous Contrast Given 10/19/22 2006)    ED Course/ Medical Decision Making/ A&P                           Medical Decision Making Patient here for evaluation of rectal pain for 1 week, gradually worsening for several days.  Has some feelings of tenesmus as well.  Domino pain fever or chills.  No nausea or vomiting.  Pain localized to the rectal area.  On exam, patient well-appearing nontoxic.  Afebrile.  He has a benign abdominal exam.  On rectal exam, he has diffuse PERI rectal tenderness, without erythema, induration or visualized abscess.  I suspect this may be related to perianal abscess, pilonidal cyst considered but felt less likely.  Rectal mass also considered.  Given patient's age and symptoms, feel like he needs labs and CT abdomen and pelvis for further evaluation.  Amount and/or Complexity of Data Reviewed Labs: ordered.    Details: Labs interpreted by me, no evidence of leukocytosis, chemistries without significant derangement. Radiology: ordered.    Details: CT abdomen and pelvis ordered for further evaluation of  symptoms.  Patient has nearly 3 cm perianal abscess asymmetric subcutaneous infiltration in the left gluteal region and superior to the greater trochanter without loculated fluid collection.  Patient has more tenderness of the right gluteal area. Discussion of management or test interpretation with external provider(s): Discussed findings with general surgery, Dr. Okey Dupre.  Recommends oral antibiotics and will follow patient in office. Patient agreeable to this plan. Return precautions were also discussed.  Risk Prescription drug management.           Final Clinical Impression(s) / ED Diagnoses Final diagnoses:  Perianal abscess    Rx / DC Orders ED Discharge Orders     None         Kem Parkinson, PA-C 10/19/22 2134    Wyvonnia Dusky, MD 10/20/22 1421

## 2022-10-19 NOTE — Discharge Instructions (Signed)
As discussed, warm sitz bath's.  Take the antibiotic as directed.  You may follow-up with the surgeon listed.  Return to the emergency department for any new or worsening symptoms.

## 2022-10-19 NOTE — ED Notes (Signed)
Went over dc papers. All questions answered. 

## 2022-10-24 ENCOUNTER — Other Ambulatory Visit: Payer: Self-pay

## 2022-10-24 ENCOUNTER — Ambulatory Visit (INDEPENDENT_AMBULATORY_CARE_PROVIDER_SITE_OTHER): Payer: Medicare PPO | Admitting: General Surgery

## 2022-10-24 ENCOUNTER — Encounter: Payer: Self-pay | Admitting: General Surgery

## 2022-10-24 VITALS — BP 111/59 | HR 68 | Temp 98.5°F | Resp 18 | Ht 73.0 in | Wt 177.0 lb

## 2022-10-24 DIAGNOSIS — K611 Rectal abscess: Secondary | ICD-10-CM | POA: Diagnosis not present

## 2022-10-24 NOTE — Progress Notes (Signed)
Leonard Noble; [id number]; [birth year]   HPI Patient is a 72 year old white male who was referred to my care by Dr. Orland Mustard in the emergency room for evaluation and treatment of perirectal abscess.  He was seen in the emergency room on 10/19/2022.  He was noted on CT scan of the abdomen to have a 2.5 cm posterior perirectal abscess.  He was having pain at that time.  His white blood cell count was within normal limits.  He was discharged home on antibiotics.  Since he has been home, he has been doing sitz bath's.  He has had slight decrease in his rectal pain.  It would go to his tailbone.  He has not noticed any blood per rectum. Past Medical History:  Diagnosis Date   Celiac artery aneurysm (HCC)    GERD (gastroesophageal reflux disease)    H/O asbestos exposure    H/O prostatitis    IBS (irritable bowel syndrome)    Pericarditis    Pulmonary nodule    SOB (shortness of breath) on exertion     Past Surgical History:  Procedure Laterality Date   CARDIAC CATHETERIZATION N/A 07/17/2015   Procedure: Left Heart Cath and Coronary Angiography;  Surgeon: Jettie Booze, MD;  Location: [facility];  Service: Cardiovascular;  Laterality: N/A;   CERVICAL SPINE SURGERY  2012   C-Spine - Neck   EYE SURGERY Left Feb. 2014   Muscle correction   HERNIA REPAIR  1994   right    SPINE SURGERY      Family History  Problem Relation Age of Onset   Cancer Mother        Multiple Myaloma   Diabetes Father    Heart attack Brother        Blood Leg- Leg   Heart disease Brother    Hypertension Brother     Current Outpatient Medications on File Prior to Visit  Medication Sig Dispense Refill   amoxicillin-clavulanate (AUGMENTIN) 875-125 MG tablet Take 1 tablet by mouth every 12 (twelve) hours. 14 tablet 0   aspirin 81 MG tablet Take 81 mg by mouth daily.     Calcium Carbonate (CALCIUM 600 PO) Take 1 capsule by mouth daily.     Cholecalciferol (VITAMIN D-3) 5000 units TABS Take by mouth.      Coenzyme Q10 (CO Q-10) 100 MG CAPS Take 100 mg by mouth daily.      ezetimibe (ZETIA) 10 MG tablet Take 10 mg by mouth daily.     Glucosamine-Chondroit-Vit C-Mn (GLUCOSAMINE 1500 COMPLEX PO) Take 1 capsule by mouth daily.     Multiple Vitamin (MULTIVITAMIN) tablet Take 1 tablet by mouth daily.     Multiple Vitamins-Minerals (PRESERVISION AREDS PO) Take by mouth.     omeprazole (PRILOSEC) 40 MG capsule Take 40 mg by mouth daily.     polyethylene glycol powder (GLYCOLAX/MIRALAX) powder Take 17 g by mouth daily as needed for mild constipation.   0   rosuvastatin (CRESTOR) 5 MG tablet Take 5 mg by mouth every other day.     vitamin B-12 (CYANOCOBALAMIN) 500 MCG tablet Take 500 mcg by mouth daily.     vitamin E (VITAMIN E) 180 MG (400 UNITS) capsule Take 400 Units by mouth daily.     No current facility-administered medications on file prior to visit.    Allergies  Allergen Reactions   Epinephrine     REACTION: PVC's   Bactrim [Sulfamethoxazole-Trimethoprim]     Nauseous, shortness of breath  Social History   Substance and Sexual Activity  Alcohol Use Yes   Alcohol/week: 2.0 standard drinks of alcohol   Types: 2 Glasses of wine per week    Social History   Tobacco Use  Smoking Status Never  Smokeless Tobacco Never    Review of Systems  Constitutional: Negative.   HENT:  Positive for sinus pain.   Eyes: Negative.   Respiratory: Negative.    Cardiovascular: Negative.   Gastrointestinal:  Positive for abdominal pain and heartburn.  Genitourinary: Negative.   Musculoskeletal:  Positive for back pain and joint pain.  Skin: Negative.   Neurological: Negative.   Endo/Heme/Allergies: Negative.   Psychiatric/Behavioral: Negative.      Objective   Vitals:   10/24/22 1320  BP: (!) 111/59  Pulse: 68  Resp: 18  Temp: 98.5 F (36.9 C)  SpO2: 98%    Physical Exam Vitals reviewed.  Constitutional:      Appearance: Normal appearance. He is normal weight. He is not  ill-appearing.  HENT:     Head: Normocephalic and atraumatic.  Cardiovascular:     Rate and Rhythm: Normal rate and regular rhythm.     Heart sounds: Normal heart sounds. No murmur heard.    No friction rub. No gallop.  Pulmonary:     Effort: Pulmonary effort is normal. No respiratory distress.     Breath sounds: Normal breath sounds. No stridor. No wheezing, rhonchi or rales.  Abdominal:     General: Bowel sounds are normal. There is no distension.     Palpations: Abdomen is soft. There is no mass.     Tenderness: There is no abdominal tenderness. There is no guarding or rebound.     Hernia: No hernia is present.  Genitourinary:    Comments: Rectal examination reveals some tenderness to digital palpation through the sphincter mechanism.  No fluctuance or mass was noted in the rectal vault.  No blood was noted.  No drainage was noted.  No fistula was noted. Skin:    General: Skin is warm and dry.  Neurological:     Mental Status: He is alert and oriented to person, place, and time.    ER visit and CT scan images personally reviewed Assessment  Perirectal abscess, resolving Plan  I told the patient to finish his antibiotic course.  He was instructed to return to my care in 3 to 4 weeks should he have residual rectal pain.  No need for acute surgical invention at this time.  Follow-up as needed.

## 2023-03-23 DIAGNOSIS — R7309 Other abnormal glucose: Secondary | ICD-10-CM | POA: Diagnosis not present

## 2023-03-23 DIAGNOSIS — R7303 Prediabetes: Secondary | ICD-10-CM | POA: Diagnosis not present

## 2023-03-23 DIAGNOSIS — E785 Hyperlipidemia, unspecified: Secondary | ICD-10-CM | POA: Diagnosis not present

## 2023-03-23 DIAGNOSIS — Z0000 Encounter for general adult medical examination without abnormal findings: Secondary | ICD-10-CM | POA: Diagnosis not present

## 2023-03-23 DIAGNOSIS — I700 Atherosclerosis of aorta: Secondary | ICD-10-CM | POA: Diagnosis not present

## 2023-03-23 DIAGNOSIS — I728 Aneurysm of other specified arteries: Secondary | ICD-10-CM | POA: Diagnosis not present

## 2023-03-27 DIAGNOSIS — M79676 Pain in unspecified toe(s): Secondary | ICD-10-CM | POA: Diagnosis not present

## 2023-03-27 DIAGNOSIS — B351 Tinea unguium: Secondary | ICD-10-CM | POA: Diagnosis not present

## 2023-03-29 DIAGNOSIS — M170 Bilateral primary osteoarthritis of knee: Secondary | ICD-10-CM | POA: Diagnosis not present

## 2023-03-29 DIAGNOSIS — M1712 Unilateral primary osteoarthritis, left knee: Secondary | ICD-10-CM | POA: Diagnosis not present

## 2023-03-29 DIAGNOSIS — M1711 Unilateral primary osteoarthritis, right knee: Secondary | ICD-10-CM | POA: Diagnosis not present

## 2023-04-09 DIAGNOSIS — M25561 Pain in right knee: Secondary | ICD-10-CM | POA: Diagnosis not present

## 2023-04-11 DIAGNOSIS — L728 Other follicular cysts of the skin and subcutaneous tissue: Secondary | ICD-10-CM | POA: Diagnosis not present

## 2023-04-19 DIAGNOSIS — M25561 Pain in right knee: Secondary | ICD-10-CM | POA: Diagnosis not present

## 2023-04-19 DIAGNOSIS — M1711 Unilateral primary osteoarthritis, right knee: Secondary | ICD-10-CM | POA: Diagnosis not present

## 2023-04-21 ENCOUNTER — Other Ambulatory Visit: Payer: Self-pay | Admitting: Orthopedic Surgery

## 2023-04-21 DIAGNOSIS — M25561 Pain in right knee: Secondary | ICD-10-CM

## 2023-04-24 ENCOUNTER — Encounter: Payer: Self-pay | Admitting: Orthopedic Surgery

## 2023-04-27 DIAGNOSIS — N182 Chronic kidney disease, stage 2 (mild): Secondary | ICD-10-CM | POA: Diagnosis not present

## 2023-04-27 DIAGNOSIS — R911 Solitary pulmonary nodule: Secondary | ICD-10-CM | POA: Diagnosis not present

## 2023-04-27 DIAGNOSIS — I728 Aneurysm of other specified arteries: Secondary | ICD-10-CM | POA: Diagnosis not present

## 2023-04-27 DIAGNOSIS — R7303 Prediabetes: Secondary | ICD-10-CM | POA: Diagnosis not present

## 2023-04-29 ENCOUNTER — Ambulatory Visit
Admission: RE | Admit: 2023-04-29 | Discharge: 2023-04-29 | Disposition: A | Discharge status: Home / Self Care | Payer: Medicare Other | Source: Ambulatory Visit | Attending: Orthopedic Surgery | Admitting: Orthopedic Surgery

## 2023-04-29 DIAGNOSIS — M25561 Pain in right knee: Secondary | ICD-10-CM | POA: Diagnosis not present

## 2023-04-29 DIAGNOSIS — M1711 Unilateral primary osteoarthritis, right knee: Secondary | ICD-10-CM | POA: Diagnosis not present

## 2023-04-29 DIAGNOSIS — M25461 Effusion, right knee: Secondary | ICD-10-CM | POA: Diagnosis not present

## 2023-04-29 DIAGNOSIS — S83231A Complex tear of medial meniscus, current injury, right knee, initial encounter: Secondary | ICD-10-CM | POA: Diagnosis not present

## 2023-05-15 DIAGNOSIS — S83241A Other tear of medial meniscus, current injury, right knee, initial encounter: Secondary | ICD-10-CM | POA: Diagnosis not present

## 2023-06-14 DIAGNOSIS — M25561 Pain in right knee: Secondary | ICD-10-CM | POA: Diagnosis not present

## 2023-06-19 DIAGNOSIS — L728 Other follicular cysts of the skin and subcutaneous tissue: Secondary | ICD-10-CM | POA: Diagnosis not present

## 2023-06-19 DIAGNOSIS — R208 Other disturbances of skin sensation: Secondary | ICD-10-CM | POA: Diagnosis not present

## 2023-06-19 DIAGNOSIS — L538 Other specified erythematous conditions: Secondary | ICD-10-CM | POA: Diagnosis not present

## 2023-06-19 DIAGNOSIS — L723 Sebaceous cyst: Secondary | ICD-10-CM | POA: Diagnosis not present

## 2023-07-12 ENCOUNTER — Other Ambulatory Visit: Payer: Self-pay | Admitting: *Deleted

## 2023-07-12 DIAGNOSIS — K551 Chronic vascular disorders of intestine: Secondary | ICD-10-CM

## 2023-07-23 ENCOUNTER — Ambulatory Visit: Payer: Medicare PPO

## 2023-07-23 ENCOUNTER — Ambulatory Visit (HOSPITAL_COMMUNITY): Payer: Medicare PPO

## 2023-08-10 ENCOUNTER — Ambulatory Visit (INDEPENDENT_AMBULATORY_CARE_PROVIDER_SITE_OTHER): Payer: Medicare PPO | Admitting: Physician Assistant

## 2023-08-10 ENCOUNTER — Ambulatory Visit (HOSPITAL_COMMUNITY)
Admission: RE | Admit: 2023-08-10 | Discharge: 2023-08-10 | Disposition: A | Discharge status: Home / Self Care | Payer: Medicare PPO | Source: Ambulatory Visit | Attending: Physician Assistant | Admitting: Physician Assistant

## 2023-08-10 VITALS — BP 106/77 | HR 60 | Temp 97.9°F | Resp 18 | Ht 73.0 in | Wt 182.4 lb

## 2023-08-10 DIAGNOSIS — K551 Chronic vascular disorders of intestine: Secondary | ICD-10-CM | POA: Insufficient documentation

## 2023-08-10 NOTE — Progress Notes (Signed)
Office Note   History of Present Illness   Leonard Noble is a 72 y.o. 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  He has known 70% stenosis of his celiac artery, however this is asymptomatic.  He presents today for follow-up.  He denies any recent changes to his medications or health.  He denies any abdominal pain, unintentional weight loss, food fear, back or chest pain.  He also denies any claudication lower extremity tissue loss, or rest pain.  Current Outpatient Medications  Medication Sig Dispense Refill   amoxicillin-clavulanate (AUGMENTIN) 875-125 MG tablet Take 1 tablet by mouth every 12 (twelve) hours. (Patient not taking: Reported on 08/10/2023) 14 tablet 0   aspirin 81 MG tablet Take 81 mg by mouth daily.     Calcium Carbonate (CALCIUM 600 PO) Take 1 capsule by mouth daily.     Cholecalciferol (VITAMIN D-3) 5000 units TABS Take by mouth.     Coenzyme Q10 (CO Q-10) 100 MG CAPS Take 100 mg by mouth daily.      ezetimibe (ZETIA) 10 MG tablet Take 10 mg by mouth daily.     Glucosamine-Chondroit-Vit C-Mn (GLUCOSAMINE 1500 COMPLEX PO) Take 1 capsule by mouth daily.     Multiple Vitamin (MULTIVITAMIN) tablet Take 1 tablet by mouth daily.     Multiple Vitamins-Minerals (PRESERVISION AREDS PO) Take by mouth.     omeprazole (PRILOSEC) 40 MG capsule Take 40 mg by mouth daily.     polyethylene glycol powder (GLYCOLAX/MIRALAX) powder Take 17 g by mouth daily as needed for mild constipation.   0   rosuvastatin (CRESTOR) 5 MG tablet Take 5 mg by mouth every other day.     vitamin B-12 (CYANOCOBALAMIN) 500 MCG tablet Take 500 mcg by mouth daily.     vitamin E (VITAMIN E) 180 MG (400 UNITS) capsule Take 400 Units by mouth daily.     No current facility-administered medications for this visit.    REVIEW OF SYSTEMS (negative unless checked):   Cardiac:  []  Chest pain or chest pressure? []  Shortness of breath upon activity? []  Shortness of breath  when lying flat? []  Irregular heart rhythm?  Vascular:  []  Pain in calf, thigh, or hip brought on by walking? []  Pain in feet at night that wakes you up from your sleep? []  Blood clot in your veins? []  Leg swelling?  Pulmonary:  []  Oxygen at home? []  Productive cough? []  Wheezing?  Neurologic:  []  Sudden weakness in arms or legs? []  Sudden numbness in arms or legs? []  Sudden onset of difficult speaking or slurred speech? []  Temporary loss of vision in one eye? []  Problems with dizziness?  Gastrointestinal:  []  Blood in stool? []  Vomited blood?  Genitourinary:  []  Burning when urinating? []  Blood in urine?  Psychiatric:  []  Major depression  Hematologic:  []  Bleeding problems? []  Problems with blood clotting?  Dermatologic:  []  Rashes or ulcers?  Constitutional:  []  Fever or chills?  Ear/Nose/Throat:  []  Change in hearing? []  Nose bleeds? []  Sore throat?  Musculoskeletal:  []  Back pain? []  Joint pain? []  Muscle pain?   Physical Examination   Vitals:   08/10/23 0816  BP: 106/77  Pulse: 60  Resp: 18  Temp: 97.9 F (36.6 C)  SpO2: 97%  Weight: 182 lb 6.4 oz (82.7 kg)  Height: 6\' 1"  (1.854 m)   Body mass index is 24.06 kg/m.  General:  WDWN in NAD; vital signs documented above Gait: Not  observed HENT: WNL, normocephalic Pulmonary: normal non-labored breathing , without rales, rhonchi,  wheezing Cardiac: Regular without carotid bruit Abdomen: soft, NT, no masses Skin: without rashes Vascular Exam/Pulses: 2+ pedal pulses Extremities: without ischemic changes, without gangrene , without cellulitis; without open wounds;  Musculoskeletal: no muscle wasting or atrophy  Neurologic: A&O X 3;  No focal weakness or paresthesias are detected Psychiatric:  The pt has Normal affect.   Non-Invasive Vascular Imaging   Mesenteric duplex (08/10/2023)  70 to 99% stenosis in the celiac artery.  Celiac artery measures 1.56 x 1.64 cm.  Splenic artery  measures 0.53 x 0.56 in the   Medical Decision Making   Leonard Noble is a 72 y.o. (03/29/1951) male who presents for surveillance of celiac artery and splenic artery aneurysms  Based on this patient's duplex, the patient's celiac and splenic artery aneurysms are fairly stable in size.  His celiac artery stenosis is also unchanged at 70 to 99% stenosis. He denies any abdominal, back, or chest pain.  He denies any unintentional weight loss or food fear.  He denies any postprandial pain. On exam his aneurysms are nonpalpable.  He has palpable pedal pulses.  He denies any claudication, rest pain, or tissue loss He can follow-up with our office in 1 year with repeat mesenteric duplex   Loel Dubonnet PA-C [facility] Office: [phone]  Clinic MD: Hetty Blend

## 2023-09-04 ENCOUNTER — Other Ambulatory Visit: Payer: Self-pay

## 2023-09-04 DIAGNOSIS — K551 Chronic vascular disorders of intestine: Secondary | ICD-10-CM

## 2023-11-06 DIAGNOSIS — L814 Other melanin hyperpigmentation: Secondary | ICD-10-CM | POA: Diagnosis not present

## 2023-11-06 DIAGNOSIS — D225 Melanocytic nevi of trunk: Secondary | ICD-10-CM | POA: Diagnosis not present

## 2023-11-06 DIAGNOSIS — L570 Actinic keratosis: Secondary | ICD-10-CM | POA: Diagnosis not present

## 2023-11-06 DIAGNOSIS — L821 Other seborrheic keratosis: Secondary | ICD-10-CM | POA: Diagnosis not present

## 2023-12-28 DIAGNOSIS — N390 Urinary tract infection, site not specified: Secondary | ICD-10-CM | POA: Diagnosis not present

## 2024-01-07 DIAGNOSIS — M545 Low back pain, unspecified: Secondary | ICD-10-CM | POA: Diagnosis not present

## 2024-01-08 DIAGNOSIS — K219 Gastro-esophageal reflux disease without esophagitis: Secondary | ICD-10-CM | POA: Diagnosis not present

## 2024-01-08 DIAGNOSIS — K5900 Constipation, unspecified: Secondary | ICD-10-CM | POA: Diagnosis not present

## 2024-01-16 DIAGNOSIS — L570 Actinic keratosis: Secondary | ICD-10-CM | POA: Diagnosis not present

## 2024-01-25 DIAGNOSIS — S39012D Strain of muscle, fascia and tendon of lower back, subsequent encounter: Secondary | ICD-10-CM | POA: Diagnosis not present

## 2024-02-05 ENCOUNTER — Ambulatory Visit (INDEPENDENT_AMBULATORY_CARE_PROVIDER_SITE_OTHER): Admitting: Urology

## 2024-02-05 ENCOUNTER — Ambulatory Visit: Admitting: Urology

## 2024-02-05 ENCOUNTER — Encounter: Payer: Self-pay | Admitting: Urology

## 2024-02-05 VITALS — BP 134/74 | HR 90

## 2024-02-05 DIAGNOSIS — R310 Gross hematuria: Secondary | ICD-10-CM | POA: Diagnosis not present

## 2024-02-05 DIAGNOSIS — Z125 Encounter for screening for malignant neoplasm of prostate: Secondary | ICD-10-CM

## 2024-02-05 DIAGNOSIS — N400 Enlarged prostate without lower urinary tract symptoms: Secondary | ICD-10-CM

## 2024-02-05 LAB — POCT URINALYSIS DIPSTICK
Bilirubin, UA: NEGATIVE
Blood, UA: NEGATIVE
Glucose, UA: NEGATIVE
Ketones, UA: NEGATIVE
Leukocytes, UA: NEGATIVE
Nitrite, UA: NEGATIVE
Protein, UA: NEGATIVE
Spec Grav, UA: <=1.005 — AB (ref 1.010–1.025)
Urobilinogen, UA: 0.2 U/dL — AB
pH, UA: 6 (ref 5.0–8.0)

## 2024-02-05 NOTE — Progress Notes (Signed)
 02/05/2024 7:41 AM   Chilton Si [birth year] [id number]  Referring provider: Farris Has, MD [address] [place],  [place] [address]  No chief complaint on file.   HPI: 73 year old male presents today for urologic check.  Before this, he has been seen in [place], last by Dr. Ezzie Dural at [facility] urology several years ago before Dr. Madilyn Hook retirement.  He denies significant lower urinary tract symptoms.  He is not on any urologic medications.  IPSS 6/2.  No complaints of ED.  Apparently he has been treated for epididymitis in the past.  He comes with records from Dr. Kateri Plummer, his PCP.  I do not see any PSAs.  He would like prostate cancer screening.    PMH: Past Medical History:  Diagnosis Date   Celiac artery aneurysm (HCC)    GERD (gastroesophageal reflux disease)    H/O asbestos exposure    H/O prostatitis    IBS (irritable bowel syndrome)    Pericarditis    Pulmonary nodule    SOB (shortness of breath) on exertion     Surgical History: Past Surgical History:  Procedure Laterality Date   CARDIAC CATHETERIZATION N/A 07/17/2015   Procedure: Left Heart Cath and Coronary Angiography;  Surgeon: Corky Crafts, MD;  Location: [facility] INVASIVE CV LAB;  Service: Cardiovascular;  Laterality: N/A;   CERVICAL SPINE SURGERY  2012   C-Spine - Neck   EYE SURGERY Left Feb. 2014   Muscle correction   HERNIA REPAIR  1994   right    SPINE SURGERY      Home Medications:  Allergies as of 02/05/2024       Reactions   Epinephrine    REACTION: PVC's   Bactrim [sulfamethoxazole-trimethoprim]    Nauseous, shortness of breath        Medication List        Accurate as of February 05, 2024  7:41 AM. If you have any questions, ask your nurse or doctor.          amoxicillin-clavulanate 875-125 MG tablet Commonly known as: AUGMENTIN Take 1 tablet by mouth every 12 (twelve) hours.   aspirin 81 MG tablet Take 81 mg by mouth daily.   CALCIUM 600 PO Take 1  capsule by mouth daily.   Co Q-10 100 MG Caps Take 100 mg by mouth daily.   cyanocobalamin 500 MCG tablet Commonly known as: VITAMIN B12 Take 500 mcg by mouth daily.   ezetimibe 10 MG tablet Commonly known as: ZETIA Take 10 mg by mouth daily.   GLUCOSAMINE 1500 COMPLEX PO Take 1 capsule by mouth daily.   multivitamin tablet Take 1 tablet by mouth daily.   omeprazole 40 MG capsule Commonly known as: PRILOSEC Take 40 mg by mouth daily.   polyethylene glycol powder 17 GM/SCOOP powder Commonly known as: GLYCOLAX/MIRALAX Take 17 g by mouth daily as needed for mild constipation.   PRESERVISION AREDS PO Take by mouth.   rosuvastatin 5 MG tablet Commonly known as: CRESTOR Take 5 mg by mouth every other day.   Vitamin D-3 125 MCG (5000 UT) Tabs Take by mouth.   vitamin E 180 MG (400 UNITS) capsule Take 400 Units by mouth daily.        Allergies:  Allergies  Allergen Reactions   Epinephrine     REACTION: PVC's   Bactrim [Sulfamethoxazole-Trimethoprim]     Nauseous, shortness of breath    Family History: Family History  Problem Relation Age of Onset   Cancer  Mother        Multiple Myaloma   Diabetes Father    Heart attack Brother        Blood Leg- Leg   Heart disease Brother    Hypertension Brother     Social History:  reports that he has never smoked. He has never used smokeless tobacco. He reports current alcohol use of about 2.0 standard drinks of alcohol per week. He reports that he does not use drugs.  ROS: All other review of systems were reviewed and are negative except what is noted above in HPI  Physical Exam: There were no vitals taken for this visit.  Constitutional:  Alert and oriented, No acute distress. HEENT:  AT, moist mucus membranes.  Trachea midline, no masses. Cardiovascular: No clubbing, cyanosis, or edema. Respiratory: Normal respiratory effort, no increased work of breathing. GI: No inguinal hernias GU: Normal phallus. No  masses/lesions on penis, testis, scrotum. Prostate 30 g smooth no nodules no induration.  Lymph: No cervical or inguinal lymphadenopathy. Skin: No rashes, bruises or suspicious lesions. Neurologic: Grossly intact, no focal deficits, moving all 4 extremities. Psychiatric: Normal mood and affect.  Laboratory Data: Lab Results  Component Value Date   WBC 7.6 10/19/2022   HGB 13.1 10/19/2022   HCT 40.1 10/19/2022   MCV 95.9 10/19/2022   PLT 256 10/19/2022    Lab Results  Component Value Date   CREATININE 0.90 10/19/2022    No results found for: "PSA"  No results found for: "TESTOSTERONE"  Lab Results  Component Value Date   HGBA1C 5.5 10/28/2019    Urinalysis No results found for: "COLORURINE", "APPEARANCEUR", "LABSPEC", "PHURINE", "GLUCOSEU", "HGBUR", "BILIRUBINUR", "KETONESUR", "PROTEINUR", "UROBILINOGEN", "NITRITE", "LEUKOCYTESUR"  No results found for: "LABMICR", "WBCUA", "RBCUA", "LABEPIT", "MUCUS", "BACTERIA"  Pertinent Imaging: CT scan of abdomen and pelvis from 2023 reviewed.  Found to have prostatomegaly.  Assessment  BPH by size criteria on CT scan, minimal symptomatology, normal exam  Screening for prostate cancer, no recent PSA  Plan:  I will check PSA today  I think if he wants routine follow-up, he can be seen every other year.  There are no diagnoses linked to this encounter.  No follow-ups on file.  Roque Collar, MD  [facility] 

## 2024-02-06 ENCOUNTER — Encounter: Payer: Self-pay | Admitting: Urology

## 2024-02-06 LAB — PSA: Prostate Specific Ag, Serum: 0.9 ng/mL (ref 0.0–4.0)

## 2024-04-01 DIAGNOSIS — R7303 Prediabetes: Secondary | ICD-10-CM | POA: Diagnosis not present

## 2024-04-01 DIAGNOSIS — F419 Anxiety disorder, unspecified: Secondary | ICD-10-CM | POA: Diagnosis not present

## 2024-04-01 DIAGNOSIS — E785 Hyperlipidemia, unspecified: Secondary | ICD-10-CM | POA: Diagnosis not present

## 2024-04-01 DIAGNOSIS — Z0000 Encounter for general adult medical examination without abnormal findings: Secondary | ICD-10-CM | POA: Diagnosis not present

## 2024-04-01 DIAGNOSIS — I728 Aneurysm of other specified arteries: Secondary | ICD-10-CM | POA: Diagnosis not present

## 2024-04-04 DIAGNOSIS — R7303 Prediabetes: Secondary | ICD-10-CM | POA: Diagnosis not present

## 2024-04-04 DIAGNOSIS — Z0000 Encounter for general adult medical examination without abnormal findings: Secondary | ICD-10-CM | POA: Diagnosis not present

## 2024-04-04 DIAGNOSIS — E785 Hyperlipidemia, unspecified: Secondary | ICD-10-CM | POA: Diagnosis not present

## 2024-06-24 DIAGNOSIS — H52223 Regular astigmatism, bilateral: Secondary | ICD-10-CM | POA: Diagnosis not present

## 2024-06-24 DIAGNOSIS — Z0100 Encounter for examination of eyes and vision without abnormal findings: Secondary | ICD-10-CM | POA: Diagnosis not present

## 2024-06-24 DIAGNOSIS — H5203 Hypermetropia, bilateral: Secondary | ICD-10-CM | POA: Diagnosis not present

## 2024-06-24 DIAGNOSIS — H2513 Age-related nuclear cataract, bilateral: Secondary | ICD-10-CM | POA: Diagnosis not present

## 2024-06-24 DIAGNOSIS — H35363 Drusen (degenerative) of macula, bilateral: Secondary | ICD-10-CM | POA: Diagnosis not present

## 2024-06-24 DIAGNOSIS — H524 Presbyopia: Secondary | ICD-10-CM | POA: Diagnosis not present

## 2024-06-24 DIAGNOSIS — H04123 Dry eye syndrome of bilateral lacrimal glands: Secondary | ICD-10-CM | POA: Diagnosis not present

## 2024-09-09 DIAGNOSIS — D485 Neoplasm of uncertain behavior of skin: Secondary | ICD-10-CM | POA: Diagnosis not present

## 2024-09-09 DIAGNOSIS — D044 Carcinoma in situ of skin of scalp and neck: Secondary | ICD-10-CM | POA: Diagnosis not present

## 2024-10-10 DIAGNOSIS — D044 Carcinoma in situ of skin of scalp and neck: Secondary | ICD-10-CM | POA: Diagnosis not present

## 2024-10-20 ENCOUNTER — Other Ambulatory Visit: Payer: Self-pay

## 2024-10-20 DIAGNOSIS — K551 Chronic vascular disorders of intestine: Secondary | ICD-10-CM

## 2024-11-12 NOTE — Progress Notes (Unsigned)
 " Office Note     CC:  follow up Requesting Provider:  Kip Righter, MD  HPI: Leonard Noble is a 74 y.o. (03-01-1951) male who presents for surveillance follow up of splenic and celiac artery aneurysms. He has known 70% stenosis of his celiac artery, however this has been asymptomatic.   Today he overall says he is doing well. He gets very occasional epigastric discomfort. He denies any other abdominal pain, unintentional weight loss, food fear, back or chest pain. He also denies any claudication lower extremity tissue loss, or rest pain. He is medically managed on Aspirin . He is no longer taking a statin. Seeing a Holistic MD who recommended he discontinue his statin and some of his other prescribed medications. He is feeling overall much better since discontinuing these medications as he feels they were causing a lot of joint and discomfort as well as impacting his breathing. He currently is undergoing a metabolic reset and is on a very strict diet to re establish his gut flora. He explains that he was having a lot of bloating and gas and belching that was uncontrollable. This resent is for entire month of January. He is hopeful that it will help him out a lot. Says presently due to the strict diet parameters that he is always hungry.  Past Medical History:  Diagnosis Date   Celiac artery aneurysm    GERD (gastroesophageal reflux disease)    H/O asbestos exposure    H/O prostatitis    IBS (irritable bowel syndrome)    Pericarditis    Pulmonary nodule    SOB (shortness of breath) on exertion     Past Surgical History:  Procedure Laterality Date   CARDIAC CATHETERIZATION N/A 07/17/2015   Procedure: Left Heart Cath and Coronary Angiography;  Surgeon: Candyce GORMAN Reek, MD;  Location: [facility] INVASIVE CV LAB;  Service: Cardiovascular;  Laterality: N/A;   CERVICAL SPINE SURGERY  2012   C-Spine - Neck   EYE SURGERY Left Feb. 2014   Muscle correction   HERNIA REPAIR  1994   right    SPINE  SURGERY      Social History   Socioeconomic History   Marital status: Married    Spouse name: Not on file   Number of children: Not on file   Years of education: Not on file   Highest education level: Not on file  Occupational History   Not on file  Tobacco Use   Smoking status: Never   Smokeless tobacco: Never  Vaping Use   Vaping status: Never Used  Substance and Sexual Activity   Alcohol use: Yes    Alcohol/week: 2.0 standard drinks of alcohol    Types: 2 Glasses of wine per week   Drug use: No   Sexual activity: Not on file  Other Topics Concern   Not on file  Social History Narrative   Not on file   Social Drivers of Health   Tobacco Use: Low Risk (11/14/2024)   Patient History    Smoking Tobacco Use: Never    Smokeless Tobacco Use: Never    Passive Exposure: Not on file  Financial Resource Strain: Not on file  Food Insecurity: Not on file  Transportation Needs: Not on file  Physical Activity: Not on file  Stress: Not on file  Social Connections: Not on file  Intimate Partner Violence: Not on file  Depression (EYV7-0): Not on file  Alcohol Screen: Not on file  Housing: Not on file  Utilities: Not on  file  Health Literacy: Not on file    Family History  Problem Relation Age of Onset   Cancer Mother        Multiple Myaloma   Diabetes Father    Heart attack Brother        Blood Leg- Leg   Heart disease Brother    Hypertension Brother     Current Outpatient Medications  Medication Sig Dispense Refill   amoxicillin -clavulanate (AUGMENTIN ) 875-125 MG tablet Take 1 tablet by mouth every 12 (twelve) hours. 14 tablet 0   aspirin  81 MG tablet Take 81 mg by mouth daily.     Calcium  Carbonate (CALCIUM  600 PO) Take 1 capsule by mouth daily.     Cholecalciferol (VITAMIN D-3) 5000 units TABS Take by mouth.     Coenzyme Q10 (CO Q-10) 100 MG CAPS Take 100 mg by mouth daily.      Glucosamine-Chondroit-Vit C-Mn (GLUCOSAMINE 1500 COMPLEX PO) Take 1 capsule by mouth  daily.     Multiple Vitamin (MULTIVITAMIN) tablet Take 1 tablet by mouth daily.     Multiple Vitamins-Minerals (PRESERVISION AREDS PO) Take by mouth.     polyethylene glycol powder (GLYCOLAX /MIRALAX ) powder Take 17 g by mouth daily as needed for mild constipation.   0   vitamin B-12 (CYANOCOBALAMIN) 500 MCG tablet Take 500 mcg by mouth daily.     vitamin E (VITAMIN E) 180 MG (400 UNITS) capsule Take 400 Units by mouth daily.     ezetimibe  (ZETIA ) 10 MG tablet Take 10 mg by mouth daily. (Patient not taking: Reported on 11/14/2024)     omeprazole (PRILOSEC) 40 MG capsule Take 40 mg by mouth daily. (Patient not taking: Reported on 11/14/2024)     rosuvastatin  (CRESTOR ) 5 MG tablet Take 5 mg by mouth every other day. (Patient not taking: Reported on 11/14/2024)     No current facility-administered medications for this visit.    Allergies[1]   REVIEW OF SYSTEMS:  Negative unless noted in HPI [X]  denotes positive finding, [ ]  denotes negative finding Cardiac  Comments:  Chest pain or chest pressure:    Shortness of breath upon exertion:    Short of breath when lying flat:    Irregular heart rhythm:        Vascular    Pain in calf, thigh, or hip brought on by ambulation:    Pain in feet at night that wakes you up from your sleep:     Blood clot in your veins:    Leg swelling:         Pulmonary    Oxygen at home:    Productive cough:     Wheezing:         Neurologic    Sudden weakness in arms or legs:     Sudden numbness in arms or legs:     Sudden onset of difficulty speaking or slurred speech:    Temporary loss of vision in one eye:     Problems with dizziness:         Gastrointestinal    Blood in stool:     Vomited blood:         Genitourinary    Burning when urinating:     Blood in urine:        Psychiatric    Major depression:         Hematologic    Bleeding problems:    Problems with blood clotting too easily:        Skin  Rashes or ulcers:         Constitutional    Fever or chills:      PHYSICAL EXAMINATION:  Vitals:   11/14/24 0934  BP: 107/68  Pulse: 68  Temp: 98 F (36.7 C)  SpO2: 98%  Weight: 174 lb (78.9 kg)  Height: 6' 1 (1.854 m)    General:  WDWN in NAD; vital signs documented above Gait: Normal HENT: WNL, normocephalic Pulmonary: normal non-labored breathing Cardiac: regular HR Abdomen: soft, NT, no masses. Normal BS. Palpable Aortic pulse present Vascular Exam/Pulses: 2+ radial, 2+ femoral and Dp, feet warm and well perfused Extremities: without ischemic changes, without Gangrene , without cellulitis; without open wounds;  Musculoskeletal: no muscle wasting or atrophy  Neurologic: A&O X 3 Psychiatric:  The pt has Normal affect.   Non-Invasive Vascular Imaging:   VAS US  Mesenteric Duplex:  Summary:  Mesenteric: 70 to 99% stenosis in the celiac artery.    Celiac artery measures 1.66 1.59 cm.  Splenic artery measures 0.92 x 0.88 cm      ASSESSMENT/PLAN:: 74 y.o. male here for follow up of splenic and celiac artery aneurysms. He has known 70% stenosis of his celiac artery, however this has been asymptomatic.  He is without any associated symptoms of mesenteric ischemia. Duplex today is overall stable. Only very slight increase in the splenic artery aneurysm size.  - Continue Aspirin  - He knows to call for earlier follow up if any new or worsening symptoms - He will follow up in 1 year with repeat mesenteric duplex    Teretha Damme, PA-C [facility] [phone]  Clinic MD:   [place]      [1]  Allergies Allergen Reactions   Epinephrine     REACTION: PVC's   Bactrim  [Sulfamethoxazole -Trimethoprim ]     Nauseous, shortness of breath   "

## 2024-11-14 ENCOUNTER — Ambulatory Visit (HOSPITAL_COMMUNITY)
Admission: RE | Admit: 2024-11-14 | Discharge: 2024-11-14 | Disposition: A | Source: Ambulatory Visit | Attending: Vascular Surgery

## 2024-11-14 ENCOUNTER — Ambulatory Visit (INDEPENDENT_AMBULATORY_CARE_PROVIDER_SITE_OTHER): Admitting: Physician Assistant

## 2024-11-14 VITALS — BP 107/68 | HR 68 | Temp 98.0°F | Ht 73.0 in | Wt 174.0 lb

## 2024-11-14 DIAGNOSIS — K551 Chronic vascular disorders of intestine: Secondary | ICD-10-CM

## 2024-11-14 DIAGNOSIS — I728 Aneurysm of other specified arteries: Secondary | ICD-10-CM | POA: Insufficient documentation
